# Patient Record
Sex: Male | Born: 1985 | Race: Black or African American | Hispanic: No | Marital: Single | Smoking: Never smoker
Health system: Southern US, Community
[De-identification: ages and names within clinical notes are randomized; demographics above are authoritative.]

## PROBLEM LIST (undated history)

## (undated) DIAGNOSIS — J029 Acute pharyngitis, unspecified: Secondary | ICD-10-CM

## (undated) HISTORY — PX: NO PAST SURGERIES: SHX2092

## (undated) HISTORY — DX: Acute pharyngitis, unspecified: J02.9

---

## 2011-08-30 ENCOUNTER — Emergency Department (HOSPITAL_COMMUNITY)
Admission: EM | Admit: 2011-08-30 | Discharge: 2011-08-30 | Disposition: A | Discharge status: Home / Self Care | Payer: Self-pay | Attending: Emergency Medicine | Admitting: Emergency Medicine

## 2011-08-30 ENCOUNTER — Encounter (HOSPITAL_COMMUNITY): Payer: Self-pay | Admitting: Emergency Medicine

## 2011-08-30 DIAGNOSIS — J069 Acute upper respiratory infection, unspecified: Secondary | ICD-10-CM | POA: Insufficient documentation

## 2011-08-30 DIAGNOSIS — R197 Diarrhea, unspecified: Secondary | ICD-10-CM | POA: Insufficient documentation

## 2011-08-30 DIAGNOSIS — R51 Headache: Secondary | ICD-10-CM | POA: Insufficient documentation

## 2011-08-30 LAB — POCT I-STAT, CHEM 8
Creatinine, Ser: 1.2 mg/dL (ref 0.50–1.35)
Hemoglobin: 17.3 g/dL — ABNORMAL HIGH (ref 13.0–17.0)
Potassium: 3.9 mEq/L (ref 3.5–5.1)
Sodium: 139 mEq/L (ref 135–145)

## 2011-08-30 LAB — CBC
HCT: 48.1 % (ref 39.0–52.0)
Hemoglobin: 16.6 g/dL (ref 13.0–17.0)
MCH: 31.6 pg (ref 26.0–34.0)
MCV: 91.6 fL (ref 78.0–100.0)
RBC: 5.25 MIL/uL (ref 4.22–5.81)

## 2011-08-30 LAB — DIFFERENTIAL
Eosinophils Absolute: 0.1 10*3/uL (ref 0.0–0.7)
Eosinophils Relative: 1 % (ref 0–5)
Lymphs Abs: 1.4 10*3/uL (ref 0.7–4.0)
Monocytes Relative: 16 % — ABNORMAL HIGH (ref 3–12)
Neutrophils Relative %: 71 % (ref 43–77)

## 2011-08-30 MED ORDER — IBUPROFEN 800 MG PO TABS
800.0000 mg | ORAL_TABLET | Freq: Once | ORAL | Status: AC
  Administered 2011-08-30: 800 mg via ORAL
  Filled 2011-08-30: qty 1

## 2011-08-30 MED ORDER — SODIUM CHLORIDE 0.9 % IV BOLUS (SEPSIS)
1000.0000 mL | Freq: Once | INTRAVENOUS | Status: AC
  Administered 2011-08-30: 1000 mL via INTRAVENOUS

## 2011-08-30 NOTE — ED Notes (Signed)
C/o fever, headache, sore throat, and runny nose since Monday.  Reports girlfriend had Mono 3 weeks ago.

## 2011-08-30 NOTE — Discharge Instructions (Signed)
Kenneth Olsen he did not have strep throat. He had an upper respiratory virus today. The mono test was also negative. Gargle with warm salt water to relieve your throat pain. Take ibuprofen 800 every 6 hours with food x24 hours. He can take Benadryl at night to help you sleep for your upper respiratory viral symptoms. Followup with PCP of your choice or get one from the list below you're not better in 2 days. He can take Lomotil over-the-counter if your diarrhea continues. Return to the ER for uncontrolled nausea vomiting and diarrhea. All other labs were normal with no signs of infection that needs to be treated with antibiotics.   RESOURCE GUIDE  Dental Problems  Patients with Medicaid: [facility] [phone] [address]                                           [phone] [address] Phone:  [phone]                                                  Phone:  [phone]  If unable to pay or uninsured, contact:  [facility] or [facility] to become qualified for the adult dental clinic.  Chronic Pain Problems Contact [facility]  [phone] Patients need to be referred by their primary care doctor.  Insufficient Money for Medicine Contact [employer]:  call "211" or [facility] 2148418132.  No Primary Care Doctor Call [facility]  [phone] Other agencies that provide inexpensive medical care    [facility]  [phone]    [facility]  [phone]    [facility]  [phone]    [facility]  [phone]    [facility]  5674539386    [facility]  [phone]  Psychological Services [facility]  [phone] [facility]  [phone] [facility]   [phone] (emergency services [phone])  Substance Abuse Resources [facility]  4037569595 [facility] 3396167206 [facility]  [phone] Floydene Flock [phone] Residential & Outpatient Substance Abuse Program  [phone]  Abuse/Neglect [facility] Child Abuse Hotline 281-085-5133 [facility] Child Abuse Hotline (603) 181-9396 (After Hours)  Emergency Shelter [facility] [phone]  M[facility] [place] [facility] [phone] [facility] [phone]  MRSA Hotline #:   865-527-6539    [facility]  [facility]     [employer]                          [facility] Dept[facility] [address] [place]                       [address]      [address]  [address]  Cristobal Goldmann Phone:  [phone]                                   Phone:  [phone]                 Phone:  [phone]  [facility] Mental [facility] Phone:  [phone]  [facility] Child Abuse Hotline (769) 755-2057 (939)572-2196 (After Hours)   Antibiotic Nonuse  Your caregiver felt that the infection or problem was not one that would be helped with an antibiotic. Infections may be caused by viruses or bacteria. Only a caregiver can tell which one of these is the likely cause of an illness. A cold is the most common cause of infection in both adults and children. A cold is a virus. Antibiotic treatment will have no effect on a viral infection. Viruses can lead to many lost days of work caring for sick children and many missed days of school. Children may catch as many as 10 "colds" or "flus" per year during which they can be tearful, cranky, and uncomfortable. The goal of treating a virus is aimed at keeping the ill person comfortable. Antibiotics are medications used to help the body fight bacterial infections. There are relatively few types of bacteria that cause infections but there are hundreds of viruses. While both viruses and bacteria cause  infection they are very different types of germs. A viral infection will typically go away by itself within 7 to 10 days. Bacterial infections may spread or get worse without antibiotic treatment. Examples of bacterial infections are:  Sore throats (like strep throat or tonsillitis).   Infection in the lung (pneumonia).   Ear and skin infections.  Examples of viral infections are:  Colds or flus.   Most coughs and bronchitis.   Sore throats not caused by Strep.   Runny noses.  It is often best not to take an antibiotic when a viral infection is the cause of the problem. Antibiotics can kill off the helpful bacteria that we have inside our body and allow harmful bacteria to start growing. Antibiotics can cause side effects such as allergies, nausea, and diarrhea without helping to improve the symptoms of the viral infection. Additionally, repeated uses of antibiotics can cause bacteria inside of our body to become resistant. That resistance can be passed onto harmful bacterial. The next time you have an infection it may be harder to treat if antibiotics are used when they are not needed. Not treating with antibiotics allows our own immune system to develop and take care of infections more efficiently. Also, antibiotics will work better for [place] when they are prescribed for bacterial infections. Treatments for a child that is ill may include:  Give extra fluids throughout the day to stay hydrated.   Get plenty of rest.   Only give your child over-the-counter or prescription medicines for pain, discomfort, or fever as directed by your caregiver.   The use of a cool mist humidifier may help stuffy noses.   Cold medications if suggested by your caregiver.  Your caregiver may decide to start you on an antibiotic if:  The problem you were seen for today continues for a longer length of time than expected.   You develop a secondary bacterial infection.  SEEK MEDICAL CARE IF:  Fever lasts  longer than 5 days.   Symptoms continue to get worse after 5 to 7 days or become severe.   Difficulty in breathing develops.   Signs of dehydration develop (poor drinking, rare urinating, dark colored urine).   Changes in behavior or worsening tiredness (listlessness or lethargy).  Document Released: 07/08/2001 Document Revised: 04/18/2011 Document Reviewed: 01/04/2009 [facility] Patient Information 2012 [place], [place].Antibiotic Nonuse  Your caregiver felt that the infection or problem was not one that would be helped with an antibiotic. Infections may be caused by viruses or bacteria. Only a caregiver can tell which one of these is the likely cause of an illness. A cold is the most common cause of infection in both adults and children. A cold is a virus. Antibiotic treatment will have no effect on a viral infection. Viruses can lead to many lost days of work caring for sick children and many missed days of school. Children may catch as many as 10 "colds" or "flus" per year during which they can be tearful, cranky, and uncomfortable. The goal of treating a virus is aimed at keeping the ill person comfortable. Antibiotics are medications used to help the body fight bacterial infections. There are relatively few types of bacteria that cause infections but there are hundreds of viruses. While both viruses and bacteria cause infection they are very different types of germs. A viral infection will typically go away by itself within 7 to 10 days. Bacterial infections may spread or get worse without antibiotic treatment. Examples of bacterial infections are:  Sore throats (like strep throat or tonsillitis).   Infection in the lung (pneumonia).   Ear and skin infections.  Examples of viral infections are:  Colds or flus.   Most coughs and bronchitis.   Sore throats not caused by Strep.   Runny noses.  It is often best not to take an antibiotic when a viral infection is the cause of the problem.  Antibiotics can kill off the helpful bacteria that we have inside our body and allow harmful bacteria to start growing. Antibiotics can cause side effects such as allergies, nausea, and diarrhea without helping to improve the symptoms of the viral infection. Additionally, repeated uses of antibiotics can cause bacteria inside of our body to become resistant. That resistance can be passed onto harmful bacterial. The next time you have an infection it may be harder to treat if antibiotics are used when they are not needed. Not treating with antibiotics allows our own immune system to develop and take care of infections more efficiently. Also, antibiotics will work better for [place] when they are prescribed for bacterial infections. Treatments for a child that is ill may include:  Give extra fluids throughout the day to stay hydrated.   Get plenty of rest.   Only give your child over-the-counter or prescription medicines for pain, discomfort, or fever as directed by your caregiver.   The use of a cool mist humidifier may help stuffy noses.   Cold medications if suggested by your caregiver.  Your caregiver may decide to start you on an antibiotic if:  The problem you were seen for today continues for a longer length of time than expected.   You develop a secondary bacterial infection.  SEEK MEDICAL CARE IF:  Fever lasts longer than 5 days.   Symptoms continue to get worse after 5 to 7 days or become severe.   Difficulty in breathing develops.   Signs of dehydration  develop (poor drinking, rare urinating, dark colored urine).   Changes in behavior or worsening tiredness (listlessness or lethargy).  Document Released: 07/08/2001 Document Revised: 04/18/2011 Document Reviewed: 01/04/2009 [facility] Patient Information 2012 [place], [place].Antibiotic Nonuse  Your caregiver felt that the infection or problem was not one that would be helped with an antibiotic. Infections may be caused by viruses or  bacteria. Only a caregiver can tell which one of these is the likely cause of an illness. A cold is the most common cause of infection in both adults and children. A cold is a virus. Antibiotic treatment will have no effect on a viral infection. Viruses can lead to many lost days of work caring for sick children and many missed days of school. Children may catch as many as 10 "colds" or "flus" per year during which they can be tearful, cranky, and uncomfortable. The goal of treating a virus is aimed at keeping the ill person comfortable. Antibiotics are medications used to help the body fight bacterial infections. There are relatively few types of bacteria that cause infections but there are hundreds of viruses. While both viruses and bacteria cause infection they are very different types of germs. A viral infection will typically go away by itself within 7 to 10 days. Bacterial infections may spread or get worse without antibiotic treatment. Examples of bacterial infections are:  Sore throats (like strep throat or tonsillitis).   Infection in the lung (pneumonia).   Ear and skin infections.  Examples of viral infections are:  Colds or flus.   Most coughs and bronchitis.   Sore throats not caused by Strep.   Runny noses.  It is often best not to take an antibiotic when a viral infection is the cause of the problem. Antibiotics can kill off the helpful bacteria that we have inside our body and allow harmful bacteria to start growing. Antibiotics can cause side effects such as allergies, nausea, and diarrhea without helping to improve the symptoms of the viral infection. Additionally, repeated uses of antibiotics can cause bacteria inside of our body to become resistant. That resistance can be passed onto harmful bacterial. The next time you have an infection it may be harder to treat if antibiotics are used when they are not needed. Not treating with antibiotics allows our own immune system to  develop and take care of infections more efficiently. Also, antibiotics will work better for [place] when they are prescribed for bacterial infections. Treatments for a child that is ill may include:  Give extra fluids throughout the day to stay hydrated.   Get plenty of rest.   Only give your child over-the-counter or prescription medicines for pain, discomfort, or fever as directed by your caregiver.   The use of a cool mist humidifier may help stuffy noses.   Cold medications if suggested by your caregiver.  Your caregiver may decide to start you on an antibiotic if:  The problem you were seen for today continues for a longer length of time than expected.   You develop a secondary bacterial infection.  SEEK MEDICAL CARE IF:  Fever lasts longer than 5 days.   Symptoms continue to get worse after 5 to 7 days or become severe.   Difficulty in breathing develops.   Signs of dehydration develop (poor drinking, rare urinating, dark colored urine).   Changes in behavior or worsening tiredness (listlessness or lethargy).  Document Released: 07/08/2001 Document Revised: 04/18/2011 Document Reviewed: 01/04/2009 [facility] Patient Information 2012 [place], [place].Antibiotic Nonuse  Your caregiver felt that the infection or problem was not one that would be helped with an antibiotic. Infections may be caused by viruses or bacteria. Only a caregiver can tell which one of these is the likely cause of an illness. A cold is the most common cause of infection in both adults and children. A cold is a virus. Antibiotic treatment will have no effect on a viral infection. Viruses can lead to many lost days of work caring for sick children and many missed days of school. Children may catch as many as 10 "colds" or "flus" per year during which they can be tearful, cranky, and uncomfortable. The goal of treating a virus is aimed at keeping the ill person comfortable. Antibiotics are medications used to help the  body fight bacterial infections. There are relatively few types of bacteria that cause infections but there are hundreds of viruses. While both viruses and bacteria cause infection they are very different types of germs. A viral infection will typically go away by itself within 7 to 10 days. Bacterial infections may spread or get worse without antibiotic treatment. Examples of bacterial infections are:  Sore throats (like strep throat or tonsillitis).   Infection in the lung (pneumonia).   Ear and skin infections.  Examples of viral infections are:  Colds or flus.   Most coughs and bronchitis.   Sore throats not caused by Strep.   Runny noses.  It is often best not to take an antibiotic when a viral infection is the cause of the problem. Antibiotics can kill off the helpful bacteria that we have inside our body and allow harmful bacteria to start growing. Antibiotics can cause side effects such as allergies, nausea, and diarrhea without helping to improve the symptoms of the viral infection. Additionally, repeated uses of antibiotics can cause bacteria inside of our body to become resistant. That resistance can be passed onto harmful bacterial. The next time you have an infection it may be harder to treat if antibiotics are used when they are not needed. Not treating with antibiotics allows our own immune system to develop and take care of infections more efficiently. Also, antibiotics will work better for [place] when they are prescribed for bacterial infections. Treatments for a child that is ill may include:  Give extra fluids throughout the day to stay hydrated.   Get plenty of rest.   Only give your child over-the-counter or prescription medicines for pain, discomfort, or fever as directed by your caregiver.   The use of a cool mist humidifier may help stuffy noses.   Cold medications if suggested by your caregiver.  Your caregiver may decide to start you on an antibiotic if:  The  problem you were seen for today continues for a longer length of time than expected.   You develop a secondary bacterial infection.  SEEK MEDICAL CARE IF:  Fever lasts longer than 5 days.   Symptoms continue to get worse after 5 to 7 days or become severe.   Difficulty in breathing develops.   Signs of dehydration develop (poor drinking, rare urinating, dark colored urine).   Changes in behavior or worsening tiredness (listlessness or lethargy).  Document Released: 07/08/2001 Document Revised: 04/18/2011 Document Reviewed: 01/04/2009 [facility] Patient Information 2012 [place], [place].

## 2011-08-30 NOTE — ED Provider Notes (Signed)
History     CSN: [id number]  Arrival date & time 08/30/11  7829   First MD Initiated Contact with Patient 08/30/11 1956      Chief Complaint  Patient presents with  . Fever    (Consider location/radiation/quality/duration/timing/severity/associated sxs/prior treatment) Patient is a 26 y.o. male presenting with fever. The history is provided by the patient and a parent. No language interpreter was used.  Fever Primary symptoms of the febrile illness include fever, fatigue, headaches and diarrhea. Primary symptoms do not include cough, wheezing, shortness of breath, abdominal pain, nausea, vomiting or dysuria. The current episode started 3 to 5 days ago. This is a new problem. The problem has not changed since onset.  Patient reports fatigue diarrhea headache fever malaise and sore throat and subjective fever and chills since Monday.  States that his girlfriend was diagnosed with mono and strep 3 weeks ago. Mom states that people at work are also sick.  History reviewed. No pertinent past medical history.  History reviewed. No pertinent past surgical history.  No family history on file.  History  Substance Use Topics  . Smoking status: Never Smoker   . Smokeless tobacco: Not on file  . Alcohol Use: Yes      Review of Systems  Constitutional: Positive for fever and fatigue.  Respiratory: Negative for cough, shortness of breath and wheezing.   Gastrointestinal: Positive for diarrhea. Negative for nausea, vomiting and abdominal pain.  Genitourinary: Negative for dysuria.  Neurological: Positive for headaches.    Allergies  Review of patient's allergies indicates no known allergies.  Home Medications  No current outpatient prescriptions on file.  BP 142/73  Pulse 77  Temp(Src) 99 F (37.2 C) (Oral)  Resp 16  SpO2 100%  Physical Exam  Nursing note and vitals reviewed. Constitutional: He is oriented to person, place, and time. He appears well-developed and  well-nourished.  HENT:  Head: Normocephalic.  Right Ear: Tympanic membrane normal.  Left Ear: Tympanic membrane normal.  Nose: Mucosal edema and rhinorrhea present. No sinus tenderness.  Mouth/Throat: Uvula is midline and mucous membranes are normal. Posterior oropharyngeal edema and posterior oropharyngeal erythema present. No oropharyngeal exudate or tonsillar abscesses.    Eyes: Conjunctivae and EOM are normal. Pupils are equal, round, and reactive to light.  Neck: Normal range of motion. Neck supple.  Cardiovascular: Normal rate.   Pulmonary/Chest: Effort normal.  Abdominal: Soft.  Musculoskeletal: Normal range of motion.  Neurological: He is alert and oriented to person, place, and time.  Skin: Skin is warm and dry.  Psychiatric: He has a normal mood and affect.    ED Course  Procedures (including critical care time)   Labs Reviewed  RAPID STREP SCREEN  MONONUCLEOSIS SCREEN  CBC  DIFFERENTIAL  EPSTEIN-BARR VIRUS VCA ANTIBODY PANEL   No results found.   No diagnosis found.    MDM  Negative stress negative mono test. Patient was given 1 L of normal saline in the ER today and ibuprofen. Afebrile. Suspect a viral infection. Treat with ibuprofen and Benadryl and hydration.   Labs Reviewed  CBC - Abnormal; Notable for the following:    WBC 11.3 (*)    All other components within normal limits  DIFFERENTIAL - Abnormal; Notable for the following:    Neutro Abs 8.0 (*)    Monocytes Relative 16 (*)    Monocytes Absolute 1.8 (*)    All other components within normal limits  POCT I-STAT, CHEM 8 - Abnormal; Notable for the following:  Calcium, Ion 1.09 (*)    Hemoglobin 17.3 (*)    All other components within normal limits  RAPID STREP SCREEN  MONONUCLEOSIS SCREEN  EPSTEIN-BARR VIRUS VCA ANTIBODY PANEL          Remi Haggard, NP 08/30/11 2217

## 2011-08-31 NOTE — ED Provider Notes (Signed)
Medical screening examination/treatment/procedure(s) were performed by non-physician practitioner and as supervising physician I was immediately available for consultation/collaboration.   Amar Keenum A Perseus Westall, MD 08/31/11 0023 

## 2011-09-01 ENCOUNTER — Inpatient Hospital Stay (HOSPITAL_COMMUNITY)
Admission: EM | Admit: 2011-09-01 | Discharge: 2011-09-03 | DRG: 866 | Disposition: A | Discharge status: Home / Self Care | Payer: Self-pay | Attending: Internal Medicine | Admitting: Internal Medicine

## 2011-09-01 ENCOUNTER — Encounter (HOSPITAL_COMMUNITY): Payer: Self-pay | Admitting: Emergency Medicine

## 2011-09-01 DIAGNOSIS — J029 Acute pharyngitis, unspecified: Secondary | ICD-10-CM

## 2011-09-01 DIAGNOSIS — B2700 Gammaherpesviral mononucleosis without complication: Secondary | ICD-10-CM | POA: Diagnosis present

## 2011-09-01 DIAGNOSIS — J391 Other abscess of pharynx: Secondary | ICD-10-CM

## 2011-09-01 DIAGNOSIS — B2790 Infectious mononucleosis, unspecified without complication: Principal | ICD-10-CM | POA: Diagnosis present

## 2011-09-01 DIAGNOSIS — R930 Abnormal findings on diagnostic imaging of skull and head, not elsewhere classified: Secondary | ICD-10-CM | POA: Diagnosis present

## 2011-09-01 DIAGNOSIS — J392 Other diseases of pharynx: Secondary | ICD-10-CM | POA: Diagnosis present

## 2011-09-01 DIAGNOSIS — J019 Acute sinusitis, unspecified: Secondary | ICD-10-CM | POA: Diagnosis present

## 2011-09-01 LAB — CBC
MCV: 90.9 fL (ref 78.0–100.0)
Platelets: 244 10*3/uL (ref 150–400)
RBC: 5.28 MIL/uL (ref 4.22–5.81)
WBC: 16.5 10*3/uL — ABNORMAL HIGH (ref 4.0–10.5)

## 2011-09-01 LAB — DIFFERENTIAL
Eosinophils Relative: 0 % (ref 0–5)
Lymphocytes Relative: 10 % — ABNORMAL LOW (ref 12–46)
Lymphs Abs: 1.7 10*3/uL (ref 0.7–4.0)
Monocytes Relative: 11 % (ref 3–12)
Neutrophils Relative %: 79 % — ABNORMAL HIGH (ref 43–77)

## 2011-09-01 LAB — POCT I-STAT, CHEM 8
BUN: 11 mg/dL (ref 6–23)
Chloride: 102 mEq/L (ref 96–112)
Potassium: 3.6 mEq/L (ref 3.5–5.1)
Sodium: 139 mEq/L (ref 135–145)

## 2011-09-01 LAB — MONONUCLEOSIS SCREEN: Mono Screen: NEGATIVE

## 2011-09-01 MED ORDER — DEXAMETHASONE SODIUM PHOSPHATE 10 MG/ML IJ SOLN
10.0000 mg | Freq: Once | INTRAMUSCULAR | Status: AC
  Administered 2011-09-01: 10 mg via INTRAVENOUS
  Filled 2011-09-01: qty 1

## 2011-09-01 MED ORDER — SODIUM CHLORIDE 0.9 % IV SOLN: 1000.0000 mL | INTRAVENOUS | Status: DC

## 2011-09-01 MED ORDER — HYDROMORPHONE HCL PF 1 MG/ML IJ SOLN
1.0000 mg | INTRAMUSCULAR | Status: DC | PRN
  Administered 2011-09-02: 1 mg via INTRAVENOUS
  Filled 2011-09-01: qty 1

## 2011-09-01 MED ORDER — SODIUM CHLORIDE 0.9 % IV BOLUS (SEPSIS)
2000.0000 mL | Freq: Once | INTRAVENOUS | Status: AC
  Administered 2011-09-01: 2000 mL via INTRAVENOUS

## 2011-09-01 MED ORDER — HYDROMORPHONE HCL PF 1 MG/ML IJ SOLN: 1.0000 mg | Freq: Once | INTRAMUSCULAR | Status: DC

## 2011-09-01 MED ORDER — PENICILLIN G BENZATHINE 1200000 UNIT/2ML IM SUSP
1.2000 10*6.[IU] | Freq: Once | INTRAMUSCULAR | Status: AC
  Administered 2011-09-01: 1.2 10*6.[IU] via INTRAMUSCULAR
  Filled 2011-09-01: qty 2

## 2011-09-01 MED ORDER — SODIUM CHLORIDE 0.9 % IV SOLN
1000.0000 mL | INTRAVENOUS | Status: DC
  Administered 2011-09-02: 1000 mL via INTRAVENOUS

## 2011-09-01 NOTE — ED Provider Notes (Signed)
History     CSN: [id number]  Arrival date & time 09/01/11  2119   First MD Initiated Contact with Patient 09/01/11 2148      Chief Complaint  Patient presents with  . Sore Throat    (Consider location/radiation/quality/duration/timing/severity/associated sxs/prior treatment) Patient is a 26 y.o. male presenting with pharyngitis. The history is provided by the patient. No language interpreter was used.  Sore Throat This is a new problem. The current episode started in the past 7 days. The problem occurs constantly. Associated symptoms include a fever and swollen glands. Pertinent negatives include no abdominal pain, nausea or vomiting. The symptoms are aggravated by drinking and swallowing. He has tried nothing for the symptoms.   Patient's complaint of sore throat since Monday. Has taken Tylenol for pain. Now presently he cannot swallow he is spitting sputum out. No past medical history. No allergies. Does not smoke.   History reviewed. No pertinent past medical history.  History reviewed. No pertinent past surgical history.  No family history on file.  History  Substance Use Topics  . Smoking status: Never Smoker   . Smokeless tobacco: Not on file  . Alcohol Use: Yes      Review of Systems  Constitutional: Positive for fever.  HENT: Negative.   Eyes: Negative.   Respiratory: Negative.  Negative for shortness of breath and stridor.   Cardiovascular: Negative.   Gastrointestinal: Negative.  Negative for nausea, vomiting and abdominal pain.  Neurological: Negative.   Psychiatric/Behavioral: Negative.   All other systems reviewed and are negative.    Allergies  Review of patient's allergies indicates no known allergies.  Home Medications   Current Outpatient Rx  Name Route Sig Dispense Refill  . ACETAMINOPHEN 325 MG PO TABS Oral Take 650 mg by mouth every 6 (six) hours as needed. For pain      BP 139/78  Pulse 86  Temp(Src) 100.7 F (38.2 C) (Oral)  Resp 19   SpO2 99%  Physical Exam  Nursing note and vitals reviewed. Constitutional: He is oriented to person, place, and time. He appears well-developed and well-nourished.  HENT:  Head: Normocephalic.  Right Ear: Tympanic membrane normal.  Left Ear: Tympanic membrane normal.  Nose: Nose normal.  Mouth/Throat: Uvula is midline. No uvula swelling. Oropharyngeal exudate, posterior oropharyngeal edema and posterior oropharyngeal erythema present. No tonsillar abscesses.    Eyes: Conjunctivae and EOM are normal. Pupils are equal, round, and reactive to light.  Neck: Normal range of motion. Neck supple.  Cardiovascular: Normal rate.   Pulmonary/Chest: Effort normal.  Abdominal: Soft.  Musculoskeletal: Normal range of motion.  Neurological: He is alert and oriented to person, place, and time.  Skin: Skin is warm and dry.  Psychiatric: He has a normal mood and affect.    ED Course  Procedures (including critical care time)   Labs Reviewed  RAPID STREP SCREEN   No results found.   No diagnosis found.    MDM  12 midnight Report given to Dr. Oletta Lamas.  Patient will remain in CDU on dehydration protocol until he can swallow and drink fluids.  Repeat decadron.  I would give him rx for 10 days of pcn and place on prednisone.         Remi Haggard, NP 09/01/11 [phone]

## 2011-09-01 NOTE — ED Notes (Addendum)
Pt to ED c/o sore throat for 1 week. Difficulty swallowing. Last took Tylenol at 1600 today.

## 2011-09-01 NOTE — ED Notes (Signed)
C/o sorethroat and fever since Monday.

## 2011-09-02 ENCOUNTER — Observation Stay (HOSPITAL_COMMUNITY): Payer: Self-pay

## 2011-09-02 ENCOUNTER — Encounter (HOSPITAL_COMMUNITY): Payer: Self-pay | Admitting: Internal Medicine

## 2011-09-02 DIAGNOSIS — J029 Acute pharyngitis, unspecified: Secondary | ICD-10-CM | POA: Diagnosis present

## 2011-09-02 HISTORY — DX: Acute pharyngitis, unspecified: J02.9

## 2011-09-02 LAB — COMPREHENSIVE METABOLIC PANEL
Albumin: 3.4 g/dL — ABNORMAL LOW (ref 3.5–5.2)
Alkaline Phosphatase: 90 U/L (ref 39–117)
BUN: 12 mg/dL (ref 6–23)
Chloride: 102 mEq/L (ref 96–112)
GFR calc Af Amer: >90 mL/min (ref >90)
Glucose, Bld: 127 mg/dL — ABNORMAL HIGH (ref 70–99)
Potassium: 3.9 mEq/L (ref 3.5–5.1)
Total Bilirubin: 0.4 mg/dL (ref 0.3–1.2)

## 2011-09-02 LAB — TECHNOLOGIST SMEAR REVIEW

## 2011-09-02 LAB — CBC
MCV: 89.8 fL (ref 78.0–100.0)
Platelets: 275 10*3/uL (ref 150–400)
RDW: 12 % (ref 11.5–15.5)
WBC: 18.5 10*3/uL — ABNORMAL HIGH (ref 4.0–10.5)

## 2011-09-02 LAB — DIFFERENTIAL
Basophils Absolute: 0 10*3/uL (ref 0.0–0.1)
Eosinophils Absolute: 0 10*3/uL (ref 0.0–0.7)
Lymphs Abs: 0.9 10*3/uL (ref 0.7–4.0)
Monocytes Absolute: 0.9 10*3/uL (ref 0.1–1.0)
Monocytes Relative: 5 % (ref 3–12)

## 2011-09-02 LAB — EXPECTORATED SPUTUM ASSESSMENT W GRAM STAIN, RFLX TO RESP C

## 2011-09-02 LAB — EPSTEIN-BARR VIRUS VCA ANTIBODY PANEL: EBV VCA IgM: 0.19 {ISR}

## 2011-09-02 LAB — HIV ANTIBODY (ROUTINE TESTING W REFLEX): HIV: NONREACTIVE

## 2011-09-02 MED ORDER — IBUPROFEN 800 MG PO TABS
800.0000 mg | ORAL_TABLET | Freq: Three times a day (TID) | ORAL | Status: AC
  Administered 2011-09-02 – 2011-09-03 (×4): 800 mg via ORAL
  Filled 2011-09-02 (×4): qty 1

## 2011-09-02 MED ORDER — ACETAMINOPHEN 325 MG PO TABS: 650.0000 mg | ORAL_TABLET | Freq: Four times a day (QID) | ORAL | Status: DC | PRN

## 2011-09-02 MED ORDER — DEXAMETHASONE SODIUM PHOSPHATE 10 MG/ML IJ SOLN
10.0000 mg | Freq: Two times a day (BID) | INTRAMUSCULAR | Status: AC
  Administered 2011-09-02 (×2): 10 mg via INTRAVENOUS
  Filled 2011-09-02 (×2): qty 1

## 2011-09-02 MED ORDER — ONDANSETRON HCL 4 MG/2ML IJ SOLN: 4.0000 mg | Freq: Four times a day (QID) | INTRAMUSCULAR | Status: DC | PRN

## 2011-09-02 MED ORDER — SODIUM CHLORIDE 0.9 % IV SOLN
INTRAVENOUS | Status: DC
  Administered 2011-09-02 – 2011-09-03 (×2): via INTRAVENOUS

## 2011-09-02 MED ORDER — IOHEXOL 300 MG/ML  SOLN
100.0000 mL | Freq: Once | INTRAMUSCULAR | Status: AC | PRN
  Administered 2011-09-02: 100 mL via INTRAVENOUS

## 2011-09-02 MED ORDER — ACETAMINOPHEN 650 MG RE SUPP: 650.0000 mg | Freq: Four times a day (QID) | RECTAL | Status: DC | PRN

## 2011-09-02 MED ORDER — MORPHINE SULFATE 2 MG/ML IJ SOLN: 2.0000 mg | INTRAMUSCULAR | Status: DC | PRN

## 2011-09-02 MED ORDER — HYDROMORPHONE HCL PF 1 MG/ML IJ SOLN
0.5000 mg | Freq: Once | INTRAMUSCULAR | Status: AC
  Administered 2011-09-02: 0.5 mg via INTRAVENOUS
  Filled 2011-09-02: qty 1

## 2011-09-02 MED ORDER — SODIUM CHLORIDE 0.9 % IV SOLN
3.0000 g | Freq: Four times a day (QID) | INTRAVENOUS | Status: DC
  Administered 2011-09-02 – 2011-09-03 (×5): 3 g via INTRAVENOUS
  Filled 2011-09-02 (×7): qty 3

## 2011-09-02 MED ORDER — ENOXAPARIN SODIUM 40 MG/0.4ML ~~LOC~~ SOLN
40.0000 mg | SUBCUTANEOUS | Status: DC
  Administered 2011-09-02: 40 mg via SUBCUTANEOUS
  Filled 2011-09-02 (×2): qty 0.4

## 2011-09-02 NOTE — ED Provider Notes (Signed)
Pt still with very hoarse voice, still unable to drink water, barely able to open mouth for me to examine.  Uvula appears to be midline.  Sig cervical adenopathy.  Will get CT scan due to inability to open mouth, trismus, hoarse voice and sig neck pain and fever, concern for retropharyngeal cellulitis or abscess.  sats are normal and lung sounds are ok.   6:30 AM Discussed with Dr. Annalee Genta.  He reports difficult to tell if pt will improve on its own.  He suggests and I agree with brief admission for continued IVF's, pain control, IV unasyn rather than just PCN and observation to ensure he is able to swallow and no breathing issues ensue over 24 hours.  Can follow up with ENT or ENT can be consulted formally if any airway issues arise or clinically worsening symptoms.  I will admit to medicine for IVF's, pain control and IV abx.     Gavin Pound. Oletta Lamas, MD 09/02/11 [phone]

## 2011-09-02 NOTE — H&P (Signed)
Hospital Admission Note Date: 09/02/2011  Patient name:  Kenneth Olsen  Medical record number:  [id number] Date of birth:  1986-04-30  Age: 26 y.o. Gender: male PCP:    DEFAULT,PROVIDER, MD, MD  Medical Service:   Internal Medicine Teaching Service   Attending physician:  Dr. Josem Kaufmann First Contact:   Dr. Bosie Clos    Pager: [phone]  Second Contact:   Dr. Anselm Jungling   Pager: [phone] After Hours:    First Contact   Pager: [phone]      Second Contact  Pager: [phone]   Chief Complaint: Sore throat  History of Present Illness: Patient is a 26 y.o. male with no significant past medical history, who presents to [facility] for evaluation of sore throat and difficulty swallowing. Patient reports his symptoms began one week prior to admission. He complained of sore throat, runny nose, and chills. He went to the ER on Friday (3 days prior to admission) where he was diagnosed with viral URI. Over the weekend his symptoms had gotten worse and he was unable to open his mouth or swallow, with fevers and chills. He reports his girlfriend is currently diagnosed with infectious mononucleosis. He reports being in close contact with her. His brother whom he lives with also has a cough. He denies joint pain, neck pain, nausea, vomiting, constipation, diarrhea, urinary symptoms such has discharge, burning, and frequency.   Current Outpatient Medications:  Current Outpatient Prescriptions  Medication Sig Dispense Refill  . acetaminophen (TYLENOL) 325 MG tablet Take 650 mg by mouth every 6 (six) hours as needed. For pain        Allergies: No Known Allergies   Past Medical History: History reviewed. No pertinent past medical history.  Past Surgical History: History reviewed. No pertinent past surgical history.  Family History: No family history on file.  Social History: History   Social History  . Marital Status: Single    Spouse Name: N/A    Number of Children: N/A  . Years of Education: college    Occupational History  .  [employer]   Social History Main Topics  . Smoking status: Never Smoker   . Smokeless tobacco: Not on file  . Alcohol Use: Yes  . Drug Use: No  . Sexually Active: Yes -- Male partner(s)    Birth Control/ Protection: None     Girlfriend on OCP   Other Topics Concern  . Not on file   Social History Narrative   [place] [place]ors degree in [place] and [place]    Review of Systems: Constitutional:  Complains of fever, chills, but denies diaphoresis, appetite change and fatigue.  HEENT: Denies photophobia, eye pain, redness, hearing loss, ear pain, but does complain of congestion, sore throat, rhinorrhea, sneezing, mouth sores, trouble swallowing.  Respiratory: Denies SOB, DOE, cough, chest tightness, and wheezing.  Cardiovascular: Denies chest pain, palpitations and leg swelling.  Gastrointestinal: Denies nausea, vomiting, abdominal pain, diarrhea, constipation, blood in stool and abdominal distention.  Genitourinary: Denies dysuria, urgency, frequency, hematuria, flank pain and difficulty urinating.  Musculoskeletal: Denies myalgias, back pain, joint swelling, arthralgias and gait problem.   Skin: Denies pallor, rash and wound.  Neurological: Denies dizziness, seizures, syncope, weakness, light-headedness, numbness and headaches.     Vital Signs: Filed Vitals:   09/02/11 0756  BP: 136/94  Pulse: 79  Temp: 98.3 F (36.8 C)  Resp: 20     Physical Exam: General: Vital signs reviewed and noted. Well-developed,  well-nourished, in no acute distress; alert, appropriate and cooperative throughout examination.  Head: Normocephalic, atraumatic.  Eyes: PERRL, EOMI, No signs of anemia or jaundince.  Nose: Nasal turbinates inflamed  Throat: Oropharynx erythematous, unable to open mouth widely due to pain, dry mucous membranes   Neck: Cervical adenopathy bilaterally, tender to palpation   Lungs:  Normal respiratory effort. Clear to auscultation BL without crackles or wheezes.  Heart: RRR. S1 and S2 normal without gallop, murmur, or rubs.  Abdomen:  BS normoactive. Soft, Nondistended, non-tender.  No masses or organomegaly.  Extremities: No pretibial edema.  Neurologic: A&O X3, CN II - XII are grossly intact. Nonfocal otherwise  Skin: No visible rashes, scars.   Lab results: Basic Metabolic Panel: Recent Labs  [facility] 09/01/11 2224 08/30/11 2100   NA 139 139   K 3.6 3.9   CL 102 104   CO2 -- --   GLUCOSE 92 92   BUN 11 8   CREATININE 1.10 1.20   CALCIUM -- --   MG -- --   PHOS -- --   CBC: Recent Labs  Basename 09/01/11 2224 09/01/11 2212 08/30/11 2045   WBC -- 16.5* 11.3*   NEUTROABS -- 13.0* 8.0*   HGB 17.7* 17.0 --   HCT 52.0 48.0 --   MCV -- 90.9 91.6   PLT -- 244 224   Rapid Strep - neg Monospot - neg   Imaging results:  Ct Soft Tissue Neck W Contrast  09/02/2011   CT NECK WITH CONTRAST  Technique: Findings: There is opacification of the left maxillary antrum, left ethmoid air cells, and portions of the left frontal and sphenoid sinuses.  Changes are likely to represent sinusitis.  Tonsils appear moderately prominent consistent with inflammatory swelling. No tonsillar or peritonsillar abscess is demonstrated.  Cervical carotid and jugular vessels appear patent.  No displacement or effacement of the cervical fat planes.  Salivary glands appear symmetrical.  The epiglottis appears mildly thickened and edematous.  There are small circumscribed fluid collections anterior to the base of the epiglottis and superior to the thyroid bone, likely in the tongue base region.  These may represent small abscesses. Scattered cervical and submental lymph nodes without pathologic enlargement, likely reactive.  Thyroid gland is unremarkable.  Visualized lung apices are unremarkable.  Cervical airways appear patent.    IMPRESSION: Mild thickening of the epiglottis.  Small  circumscribed fluid collections in the base of the tongue and in the suprahyoid soft tissues suggesting small abscesses.  Probable reactive cervical and submental lymph nodes.  Mild tonsillar swelling.  Opacification of the left paranasal sinuses suggesting sinusitis.    Assessment & Plan: This is a 26 year old man with no significant past medical history admitted for sore throat of 1 week duration.   1.) Acute Pharyngitis - Patient presenting with fever, sore throat, exudates, and without cough, making Group A Strep Pharyngitis a likely culprit. Rapid strep was negative, however a negative test does not definitively rule out strep pharyngitis. The patient also provides history of his girlfriend having infectious mononucleosis. Monospot test is negative, however there tends to be a higher rate of false negatives early in the illness. Thus both mononucleosis and bacterial pharyngitis is on the differential. Gonococcal pharyngitis also considered but less likely given history and clinical presentation.   Plan: -Admit for hydration, IV abx -Will continue patient on Unasyn which was started in the ER. Patient should be on PCN based regimen. Patient can be transitioned to amoxicillin or augmentin (  amox/clav) orally once throat swelling improves -Will give 2 doses of dexamethasone 12 hours apart to help symptomatically  -Scheduled ibuprofen tid -Sputum culture  -HIV antibody   -ENT was consulted, they reviewed patient's CT scan, recommended continuing steroids and will be glad to see patient if he does not improve clinically in next day or so   DVT PPX - Lovenox       Melida Quitter, M.D. (PGY3)      Date/ Time: 09/02/2011 8:54 am      I have seen and examined the patient. I reviewed the resident/fellow note and agree with the findings and plan of care as documented. My additions and revisions are included.   Signature:  ____________________________________________     Internal Medicine  Teaching Service Attending    Date:    ____________________________________________

## 2011-09-02 NOTE — ED Provider Notes (Signed)
.  Medical screening examination/treatment/procedure(s) were performed by non-physician practitioner and as supervising physician I was immediately available for consultation/collaboration.   Carleene Cooper III, MD 09/02/11 [phone]

## 2011-09-02 NOTE — ED Notes (Signed)
Admitting doctor at bedside 

## 2011-09-03 DIAGNOSIS — R930 Abnormal findings on diagnostic imaging of skull and head, not elsewhere classified: Secondary | ICD-10-CM | POA: Diagnosis present

## 2011-09-03 DIAGNOSIS — B2700 Gammaherpesviral mononucleosis without complication: Secondary | ICD-10-CM | POA: Diagnosis present

## 2011-09-03 LAB — EPSTEIN-BARR VIRUS VCA ANTIBODY PANEL
EBV EA IgG: 0.77 {ISR}
EBV NA IgG: 3.17 {ISR} — ABNORMAL HIGH

## 2011-09-03 LAB — CBC
Platelets: 284 10*3/uL (ref 150–400)
RBC: 4.49 MIL/uL (ref 4.22–5.81)
WBC: 18.7 10*3/uL — ABNORMAL HIGH (ref 4.0–10.5)

## 2011-09-03 MED ORDER — IBUPROFEN 600 MG PO TABS: 600.0000 mg | ORAL_TABLET | Freq: Three times a day (TID) | ORAL | Status: AC | PRN

## 2011-09-03 MED ORDER — PENICILLIN V POTASSIUM 500 MG PO TABS
500.0000 mg | ORAL_TABLET | Freq: Four times a day (QID) | ORAL | Status: DC
  Administered 2011-09-03: 500 mg via ORAL
  Filled 2011-09-03 (×4): qty 1

## 2011-09-03 MED ORDER — PREDNISONE 20 MG PO TABS
20.0000 mg | ORAL_TABLET | Freq: Every day | ORAL | Status: DC
  Filled 2011-09-03: qty 1

## 2011-09-03 MED ORDER — PENICILLIN V POTASSIUM 500 MG PO TABS: 500.0000 mg | ORAL_TABLET | Freq: Four times a day (QID) | ORAL | Status: AC

## 2011-09-03 MED ORDER — PREDNISONE 20 MG PO TABS: 20.0000 mg | ORAL_TABLET | Freq: Every day | ORAL | Status: AC

## 2011-09-03 NOTE — H&P (Signed)
Internal Medicine Attending Admission Note Date: 09/03/2011  Patient name: Kenneth Olsen Medical record number: [id number] Date of birth: 1986-02-08 Age: 26 y.o. Gender: male  I saw and evaluated the patient. I reviewed the resident's note and I agree with the resident's findings and plan as documented in the resident's note.  Chief Complaint(s): Sore throat and difficulty swallowing x 1 week.  History - key components related to admission:  Kenneth Olsen is a 26 year old man with no previous past medical history who presents for evaluation of a sore throat and difficulty swallowing for one week. The symptoms initially were a runny nose and chills with a sore throat. 3 days prior to admission he presented to the emergency department with these complaints and was diagnosed with an upper respiratory infection. Over the next 3 days his symptoms progressed to the point that he was unable to open his mouth and could not swallow his secretions. He also continued to have fevers and chills. He denied any rashes, muscle or joint pain, diarrhea, dysuria, but admitted to a sick contact with his girlfriend who was recently diagnosed with mononucleosis and a brother who had a cough.  In the emergency department a CT scan was obtained which demonstrated left maxillary and ethmoid opacification consistent with sinusitis and small circumscribed  fluid collections anterior to the base of the epiglottis and at the base of the tongue. He was therefore admitted for aggressive IV antibiotic therapy and observation.  Physical Exam - key components related to admission:  Filed Vitals:   09/02/11 1038 09/02/11 1100 09/02/11 2132 09/03/11 0518  BP: 138/80 133/85 118/74 128/76  Pulse: 77 67 61 70  Temp: 98.5 F (36.9 C) 97.7 F (36.5 C) 98.9 F (37.2 C) 97.1 F (36.2 C)  TempSrc: Oral Oral    Resp: 20 20 20 18   SpO2: 100% 100% 100% 100%   Gen.: Well-developed, well-nourished, man lying comfortably in bed in no acute  distress. He stated his symptoms were markedly improved upon admission.  HEENT: He was able to open his mouth without difficulty or soreness. There were no excessive secretions in his oropharynx. I was unable to visualize the posterior pharynx. There was halitosis present. Neck: Shotty cervical adenopathy. Lungs: Clear to auscultation bilaterally without wheezes, rhonchi, or rales. Heart: Regular rate and rhythm without murmurs, rubs, or gallops. Abdomen: Soft, nontender, without masses. Extremities: Without edema.  Lab results:  Basic Metabolic Panel:  Basename 09/02/11 1140 09/01/11 2224  NA 139 139  K 3.9 3.6  CL 102 102  CO2 25 --  GLUCOSE 127* 92  BUN 12 11  CREATININE 0.82 1.10  CALCIUM 9.3 --  MG -- --  PHOS -- --   Liver Function Tests:  Basename 09/02/11 1140  AST 24  ALT 13  ALKPHOS 90  BILITOT 0.4  PROT 7.7  ALBUMIN 3.4*   CBC:  Basename 09/03/11 0530 09/02/11 1140 09/01/11 2212  WBC 18.7* 18.5* --  NEUTROABS -- 16.7* 13.0*  HGB 14.1 15.3 --  HCT 40.1 43.0 --  MCV 89.3 89.8 --  PLT 284 275 --   Misc. Labs:  Rapid strep test negative Monospot negative   Imaging results:  Ct Soft Tissue Neck W Contrast  09/02/2011  *RADIOLOGY REPORT*  Clinical Data: Sore throat, fever, difficulty swallowing for 1 week.  CT NECK WITH CONTRAST  Technique:  Multidetector CT imaging of the neck was performed with intravenous contrast.  Contrast: OMNIPAQUE IOHEXOL 300 MG/ML  SOLN  Comparison: None.  Findings:  There is opacification of the left maxillary antrum, left ethmoid air cells, and portions of the left frontal and sphenoid sinuses.  Changes are likely to represent sinusitis.  Tonsils appear moderately prominent consistent with inflammatory swelling. No tonsillar or peritonsillar abscess is demonstrated.  Cervical carotid and jugular vessels appear patent.  No displacement or effacement of the cervical fat planes.  Salivary glands appear symmetrical.  The epiglottis  appears mildly thickened and edematous.  There are small circumscribed fluid collections anterior to the base of the epiglottis and superior to the thyroid bone, likely in the tongue base region.  These may represent small abscesses. Scattered cervical and submental lymph nodes without pathologic enlargement, likely reactive.  Thyroid gland is unremarkable.  Visualized lung apices are unremarkable.  Cervical airways appear patent.  IMPRESSION: Mild thickening of the epiglottis.  Small circumscribed fluid collections in the base of the tongue and in the suprahyoid soft tissues suggesting small abscesses.  Probable reactive cervical and submental lymph nodes.  Mild tonsillar swelling.  Opacification of the left paranasal sinuses suggesting sinusitis.  Original Report Authenticated By: Marlon Pel, M.D.   Assessment & Plan by Problem:  Kenneth Olsen is a 26 year old man who presents with a 1 week history of sore throat and difficulty swallowing. The symptoms are associated with subjective fevers and chills. Exam along with radiography suggests an acute left-sided sinusitis, pharyngitis, with some small abscesses. He responded extremely well to the Unasyn and steroids he received yesterday. He now is able to and swallow without difficulty and is symptomatically much improved. He would like to go home.  1) Pharyngitis/sinusitis: We will convert Kenneth Olsen to Pen V for 10 days. We will also continue the steroids at a dose of prednisone 20 mg daily for 5 days. He will be encouraged to use anti-inflammatories for any pain on an as-needed basis. Disposition will include followup in the primary care clinic to assure he is improved. If his symptoms completely resolve there will be no need for followup radiography. If his symptoms were to persist or worsen ENT would be reconsult for assessment after we repeated his CT scan of the neck. I agree with the housestaff's plan to discharge Kenneth Olsen home today.

## 2011-09-03 NOTE — Progress Notes (Signed)
Patient was given discharge instructions.  He verbalized understanding and confirmed that his prescriptions were called into [facility].  He took all belongings with him.  He verbalized understanding regarding checking for signs of sore throat.  He will follow up with his MD on 09/09/11.  Sherial Ebrahim N

## 2011-09-03 NOTE — Progress Notes (Signed)
Chart reviewed , noted that pt is employed however does not have insurance. Will assist with any d/c needs as needed.  Johny Shock RN MPH Case Manager [phone]

## 2011-09-03 NOTE — Progress Notes (Signed)
Subjective: No acute events overnight  Objective: Vital signs in last 24 hours: Filed Vitals:   09/02/11 1038 09/02/11 1100 09/02/11 2132 09/03/11 0518  BP: 138/80 133/85 118/74 128/76  Pulse: 77 67 61 70  Temp: 98.5 F (36.9 C) 97.7 F (36.5 C) 98.9 F (37.2 C) 97.1 F (36.2 C)  TempSrc: Oral Oral    Resp: 20 20 20 18   SpO2: 100% 100% 100% 100%   Weight change:   Intake/Output Summary (Last 24 hours) at 09/03/11 0926 Last data filed at 09/02/11 1900  Gross per 24 hour  Intake   1680 ml  Output      0 ml  Net   1680 ml   Physical Exam: General: Well-developed, well-nourished, in no acute distress; Head: Normocephalic, atraumatic. Eyes: PERRLA, EOMI, No signs of anemia or jaundice. Nose: Moist mucous membranes, no purulence, normal turbinates Throat: Oropharynx nonerythematous, no exudate appreciated.  Neck: supple, no masses, no carotid Bruits, no JVD appreciated. Lungs: Normal respiratory effort. Clear to auscultation bilaterally from apices to bases without crackles or wheezes appreciated. Heart: normal rate, regular rhythm, normal S1 and S2, no gallop, murmur, or rubs appreciated. Abdomen: BS normoactive. Soft, Nondistended, non-tender. No masses or organomegaly appreciated. Extremities: No pretibial edema, distal pulses intact Neurologic: grossly non-focal, alert and oriented x3, appropriate and cooperative throughout examination.    Lab Results: Basic Metabolic Panel:  Lab 09/02/11 4782 09/01/11 2224  NA 139 139  K 3.9 3.6  CL 102 102  CO2 25 --  GLUCOSE 127* 92  BUN 12 11  CREATININE 0.82 1.10  CALCIUM 9.3 --  MG -- --  PHOS -- --   Liver Function Tests:  Lab 09/02/11 1140  AST 24  ALT 13  ALKPHOS 90  BILITOT 0.4  PROT 7.7  ALBUMIN 3.4*    CBC:  Lab 09/03/11 0530 09/02/11 1140 09/01/11 2212  WBC 18.7* 18.5* --  NEUTROABS -- 16.7* 13.0*  HGB 14.1 15.3 --  HCT 40.1 43.0 --  MCV 89.3 89.8 --  PLT 284 275 --     Micro Results: Recent  Results (from the past 240 hour(s))  RAPID STREP SCREEN     Status: Normal   Collection Time   08/30/11  7:43 PM      Component Value Range Status Comment   Streptococcus, Group A Screen (Direct) NEGATIVE  NEGATIVE  Final   RAPID STREP SCREEN     Status: Normal   Collection Time   09/01/11  9:49 PM      Component Value Range Status Comment   Streptococcus, Group A Screen (Direct) NEGATIVE  NEGATIVE  Final   TECHNOLOGIST SMEAR REVIEW     Status: Normal   Collection Time   09/02/11 11:40 AM      Component Value Range Status Comment   Path Review RARE ATYPICAL LYMPHOCYTES   Final RED CELL MORPHOLOGY UNREMARKABLE  CULTURE, SPUTUM-ASSESSMENT     Status: Normal   Collection Time   09/02/11  3:33 PM      Component Value Range Status Comment   Specimen Description SPUTUM   Final    Special Requests NONE   Final    Sputum evaluation     Final    Value: THIS SPECIMEN IS ACCEPTABLE. RESPIRATORY CULTURE REPORT TO FOLLOW.   Report Status 09/02/2011 FINAL   Final   CULTURE, RESPIRATORY     Status: Normal (Preliminary result)   Collection Time   09/02/11  3:33 PM  Component Value Range Status Comment   Specimen Description SPUTUM   Final    Special Requests NONE   Final    Gram Stain     Final    Value: FEW WBC PRESENT, PREDOMINANTLY PMN     FEW SQUAMOUS EPITHELIAL CELLS PRESENT     FEW GRAM POSITIVE COCCI     IN PAIRS RARE GRAM NEGATIVE RODS   Culture PENDING   Incomplete    Report Status PENDING   Incomplete    Studies/Results: Ct Soft Tissue Neck W Contrast  09/02/2011  *RADIOLOGY REPORT*  Clinical Data: Sore throat, fever, difficulty swallowing for 1 week.  CT NECK WITH CONTRAST  Technique:  Multidetector CT imaging of the neck was performed with intravenous contrast.  Contrast: OMNIPAQUE IOHEXOL 300 MG/ML  SOLN  Comparison: None.  Findings: There is opacification of the left maxillary antrum, left ethmoid air cells, and portions of the left frontal and sphenoid sinuses.  Changes  are likely to represent sinusitis.  Tonsils appear moderately prominent consistent with inflammatory swelling. No tonsillar or peritonsillar abscess is demonstrated.  Cervical carotid and jugular vessels appear patent.  No displacement or effacement of the cervical fat planes.  Salivary glands appear symmetrical.  The epiglottis appears mildly thickened and edematous.  There are small circumscribed fluid collections anterior to the base of the epiglottis and superior to the thyroid bone, likely in the tongue base region.  These may represent small abscesses. Scattered cervical and submental lymph nodes without pathologic enlargement, likely reactive.  Thyroid gland is unremarkable.  Visualized lung apices are unremarkable.  Cervical airways appear patent.  IMPRESSION: Mild thickening of the epiglottis.  Small circumscribed fluid collections in the base of the tongue and in the suprahyoid soft tissues suggesting small abscesses.  Probable reactive cervical and submental lymph nodes.  Mild tonsillar swelling.  Opacification of the left paranasal sinuses suggesting sinusitis.  Original Report Authenticated By: Marlon Pel, M.D.   Medications: I have reviewed the patient's current medications. Scheduled Meds:   . ampicillin-sulbactam (UNASYN) IV  3 g Intravenous Q6H  . dexamethasone  10 mg Intravenous Q12H  . enoxaparin  40 mg Subcutaneous Q24H  . ibuprofen  800 mg Oral TID   Continuous Infusions:   . sodium chloride 125 mL/hr at 09/03/11 0343  . DISCONTD: sodium chloride 1,000 mL (09/02/11 0007)  . DISCONTD: sodium chloride     PRN Meds:.acetaminophen, acetaminophen, morphine, ondansetron, DISCONTD:  HYDROmorphone (DILAUDID) injection  Assessment/Plan: HD #21 for 26 year old man admitted for sore throat of 1 week duration with exposure to infectious mononucleosis.   1.) Group A Strep Pharyngitis, likely - presenting with fever, sore throat, exudates, and without cough, making Group A Strep  Pharyngitis a likely culprit. Rapid strep was negative, however a negative test does not definitively rule out strep pharyngitis.  Initial Monospot test is negative, Currently on Unasyn, s/p dexamethasone x2, tolerating full diet Plan:  -transition to oral Penicillin V for 10 day course -cont scheduled ibuprofen tid  -Sputum culture pending -HIV antibody negative -cont steroids with prednisone 20 mg qd x 5 days -EBV pending    Lab 09/03/11 0530 09/02/11 1140 09/01/11 2224 09/01/11 2212  HGB 14.1 15.3 17.7* --  HCT 40.1 43.0 52.0 --  WBC 18.7* 18.5* -- 16.5*  PLT 284 275 -- 244    Differential  Ref. Range 09/01/2011 22:12 09/02/2011 11:40  Neutrophils Relative Latest Range: 43-77 % 79 (H) 90 (H)  Lymphocytes Relative Latest Range:  12-46 % 10 (L) 5 (L)  Monocytes Relative Latest Range: 3-12 % 11 5  Eosinophils Relative Latest Range: 0-5 % 0 0  Basophils Relative Latest Range: 0-1 % 0 0  NEUT# Latest Range: 1.7-7.7 K/uL 13.0 (H) 16.7 (H)  Lymphocytes Absolute Latest Range: 0.7-4.0 K/uL 1.7 0.9  Monocytes Absolute Latest Range: 0.1-1.0 K/uL 1.8 (H) 0.9  Eosinophils Absolute Latest Range: 0.0-0.7 K/uL 0.0 0.0  Basophils Absolute Latest Range: 0.0-0.1 K/uL 0.0 0.0  WBC Morphology No range found MILD LEFT SHIFT (1-5% METAS, OCC MYELO, OCC BANDS)       LOS: 2 days   Kenneth Olsen 09/03/2011, 9:26 AM

## 2011-09-03 NOTE — Discharge Summary (Signed)
Internal Medicine [facility] Discharge Note  Name: Kenneth Olsen MRN: [id number] DOB: [birth year] 26 y.o.  Date of Admission: 09/01/2011  9:29 PM Date of Discharge: 09/03/2011 Attending Physician: Doneen Poisson, MD  Discharge Diagnosis: Principal Problem:  *Pharyngitis Active Problems:  EBV positive mononucleosis syndrome  Abnormal CT of paranasal sinuses   Discharge Medications: Medication List  As of 09/03/2011  2:42 PM   TAKE these medications         acetaminophen 325 MG tablet   Commonly known as: TYLENOL   Take 650 mg by mouth every 6 (six) hours as needed. For pain      ibuprofen 600 MG tablet   Commonly known as: ADVIL,MOTRIN   Take 1 tablet (600 mg total) by mouth every 8 (eight) hours as needed for pain.      penicillin v potassium 500 MG tablet   Commonly known as: VEETID   Take 1 tablet (500 mg total) by mouth 4 (four) times daily.      predniSONE 20 MG tablet   Commonly known as: DELTASONE   Take 1 tablet (20 mg total) by mouth daily with breakfast.            Disposition and follow-up:   KennethKenneth Olsen was discharged from [facility] in stable and improved condition.  He is to follow-up with the Internal Medicine Clinic as below.  At that time he should be assessed for resolution of his pharyngitis.  In addition, pending labs at time of discharge should be reviewed which includes positive Malachi Carl Virus Panel indicating past mononucleosis infection.  Follow-up Appointments: Follow-up Information    Follow up with [facility], MD on 09/09/2011. (at 2:15pm)    Contact information:   Internal Medicine Clinic [address] [place] [address] [phone]         Discharge Orders    Future Appointments: Provider: Department: Dept Phone: Center:   09/09/2011 2:15 PM Zollie Beckers, MD [facility] [phone] [facility]     Future Orders Please Complete By Expires   Discharge instructions       Comments:   Keep scheduled appointment with at the Internal Medicine Clinic and take the medications as prescribed.   Call MD for:  difficulty breathing, headache or visual disturbances         Consultations:ENT reviewed CT scan    Procedures Performed:  Ct Soft Tissue Neck W Contrast  09/02/2011  *RADIOLOGY REPORT*  Clinical Data: Sore throat, fever, difficulty swallowing for 1 week.  CT NECK WITH CONTRAST  Technique:  Multidetector CT imaging of the neck was performed with intravenous contrast.  Contrast: OMNIPAQUE IOHEXOL 300 MG/ML  SOLN  Comparison: None.  Findings: There is opacification of the left maxillary antrum, left ethmoid air cells, and portions of the left frontal and sphenoid sinuses.  Changes are likely to represent sinusitis.  Tonsils appear moderately prominent consistent with inflammatory swelling. No tonsillar or peritonsillar abscess is demonstrated.  Cervical carotid and jugular vessels appear patent.  No displacement or effacement of the cervical fat planes.  Salivary glands appear symmetrical.  The epiglottis appears mildly thickened and edematous.  There are small circumscribed fluid collections anterior to the base of the epiglottis and superior to the thyroid bone, likely in the tongue base region.  These may represent small abscesses. Scattered cervical and submental lymph nodes without pathologic enlargement, likely reactive.  Thyroid gland is unremarkable.  Visualized lung apices are unremarkable.  Cervical  airways appear patent.  IMPRESSION: Mild thickening of the epiglottis.  Small circumscribed fluid collections in the base of the tongue and in the suprahyoid soft tissues suggesting small abscesses.  Probable reactive cervical and submental lymph nodes.  Mild tonsillar swelling.  Opacification of the left paranasal sinuses suggesting sinusitis.  Original Report Authenticated By: Marlon Pel, M.D.    Admission HPI: Patient is a 27 y.o. male with no  significant past medical history, who presents to [facility] for evaluation of sore throat and difficulty swallowing. Patient reports his symptoms began one week prior to admission. He complained of sore throat, runny nose, and chills. He went to the ER on Friday (3 days prior to admission) where he was diagnosed with viral URI. Over the weekend his symptoms had gotten worse and he was unable to open his mouth or swallow, with fevers and chills. He reports his girlfriend is currently diagnosed with infectious mononucleosis. He reports being in close contact with her. His brother whom he lives with also has a cough. He denies joint pain, neck pain, nausea, vomiting, constipation, diarrhea, urinary symptoms such has discharge, burning, and frequency.   Admission Physical Exam:  General:  Vital signs reviewed and noted. Well-developed, well-nourished, in no acute distress; alert, appropriate and cooperative throughout examination.   Head:  Normocephalic, atraumatic.   Eyes:  PERRL, EOMI, No signs of anemia or jaundince.   Nose:  Nasal turbinates inflamed   Throat:  Oropharynx erythematous, unable to open mouth widely due to pain, dry mucous membranes   Neck:  Cervical adenopathy bilaterally, tender to palpation   Lungs:  Normal respiratory effort. Clear to auscultation BL without crackles or wheezes.   Heart:  RRR. S1 and S2 normal without gallop, murmur, or rubs.   Abdomen:  BS normoactive. Soft, Nondistended, non-tender. No masses or organomegaly.   Extremities:  No pretibial edema.   Neurologic:  A&O X3, CN II - XII are grossly intact. Nonfocal otherwise   Skin:  No visible rashes, scars.      Hospital Course by problem list:   #1 Pharyngitis: Although his mononucleosis screen was negative, Kenneth Olsen pharyngitis is likely secondary to Malachi Carl virus infectious mononucleosis.  He had positive EBV Panel indicating past exposure (positive VCA IgG, positive EBV NA IgG, negative VCA IgM, and negative EBV  EA IgG). He received Decadron 10 mg IV x2 in the ED, was admitted and treated with Unasyn IV 3g tid x 2 days, given maintenance IV fluids, and IV Dilaudid.  By Hospital Day #2 he was feeling much better and able to eat full regular meals without complaints of pain or sore throat. He was discharged with Penicillin V 500  Mg qid for 10 day course for possible bacterial pharyngitis in addition to 5 day course of prednisone 20 mg and prn Ibuprofen. He will follow-up in the Internal Medicine Clinic as noted above.  Discharge Vitals:  BP 128/76  Pulse 70  Temp(Src) 97.1 F (36.2 C) (Oral)  Resp 18  SpO2 100%  Discharge Labs:  Results for orders placed during the hospital encounter of 09/01/11 (from the past 24 hour(s))  CULTURE, SPUTUM-ASSESSMENT     Status: Normal   Collection Time   09/02/11  3:33 PM      Component Value Range   Specimen Description SPUTUM     Special Requests NONE     Sputum evaluation       Value: THIS SPECIMEN IS ACCEPTABLE. RESPIRATORY CULTURE REPORT TO FOLLOW.  Report Status 09/02/2011 FINAL    CULTURE, RESPIRATORY     Status: Normal (Preliminary result)   Collection Time   09/02/11  3:33 PM      Component Value Range   Specimen Description SPUTUM     Special Requests NONE     Gram Stain       Value: FEW WBC PRESENT, PREDOMINANTLY PMN     FEW SQUAMOUS EPITHELIAL CELLS PRESENT     FEW GRAM POSITIVE COCCI     IN PAIRS RARE GRAM NEGATIVE RODS   Culture NO GROWTH     Report Status PENDING    CBC     Status: Abnormal   Collection Time   09/03/11  5:30 AM      Component Value Range   WBC 18.7 (*) 4.0 - 10.5 (K/uL)   RBC 4.49  4.22 - 5.81 (MIL/uL)   Hemoglobin 14.1  13.0 - 17.0 (g/dL)   HCT 40.9  81.1 - 91.4 (%)   MCV 89.3  78.0 - 100.0 (fL)   MCH 31.4  26.0 - 34.0 (pg)   MCHC 35.2  30.0 - 36.0 (g/dL)   RDW 78.2  95.6 - 21.3 (%)   Platelets 284  150 - 400 (K/uL)    Signed: Gionni Vaca 09/03/2011, 2:42 PM   Time Spent on Discharge: >30

## 2011-09-05 LAB — CULTURE, RESPIRATORY W GRAM STAIN: Culture: NORMAL

## 2011-09-05 NOTE — Discharge Summary (Signed)
Agree with Dr. Marge Duncans discharge summary except Kenneth Olsen' pharyngitis was likely bacterial or secondary to a virus other than EBV.  His serology is consistent with a remote prior to infection and not a recent infection.  Therefore, he did not have EBV pharyngitis.

## 2011-09-09 ENCOUNTER — Encounter: Payer: Self-pay | Admitting: Internal Medicine

## 2011-09-09 ENCOUNTER — Ambulatory Visit (INDEPENDENT_AMBULATORY_CARE_PROVIDER_SITE_OTHER): Payer: Self-pay | Admitting: Internal Medicine

## 2011-09-09 VITALS — BP 118/71 | HR 78 | Temp 97.4°F | Ht 71.0 in | Wt 144.8 lb

## 2011-09-09 DIAGNOSIS — J029 Acute pharyngitis, unspecified: Secondary | ICD-10-CM

## 2011-09-09 NOTE — Assessment & Plan Note (Signed)
Resolved

## 2011-09-09 NOTE — Progress Notes (Signed)
Patient ID: Kenneth Olsen, male   DOB: 09/18/1985, 26 y.o.   MRN: [id number]  26 year old man with no past medical history who was recently admitted for pharyngitis from mono vs Group A strep with throat swelling.  He was discharged on pen V, steroid which he has been compliant with. His symptoms have resolved.  No new complaints.    Physical exam   General Appearance:     Filed Vitals:   09/09/11 1439  BP: 118/71  Pulse: 78  Temp: 97.4 F (36.3 C)  TempSrc: Oral  Height: 5\' 11"  (1.803 m)  Weight: 144 lb 12.8 oz (65.681 kg)  SpO2: 100%     Alert, cooperative, no distress, appears stated age  Head:    Normocephalic, without obvious abnormality, atraumatic  Eyes:    PERRL, conjunctiva/corneas clear, EOM's intact, fundi    benign, both eyes       Neck:   Supple, symmetrical, trachea midline, no adenopathy;       thyroid:  No enlargement/tenderness/nodules; no carotid   bruit or JVD  Lungs:     Clear to auscultation bilaterally, respirations unlabored  Chest wall:    No tenderness or deformity  Heart:    Regular rate and rhythm, S1 and S2 normal, no murmur, rub   or gallop  Abdomen:     Soft, non-tender, bowel sounds active all four quadrants,    no masses, no organomegaly  Extremities:   Extremities normal, atraumatic, no cyanosis or edema  Pulses:   2+ and symmetric all extremities  Skin:   Skin color, texture, turgor normal, no rashes or lesions  Neurologic:  nonfocal grossly    ROS-   Constitutional: Denies fever, chills, diaphoresis, appetite change and fatigue.  Respiratory: Denies SOB, DOE, cough, chest tightness,  and wheezing.   Cardiovascular: Denies chest pain, palpitations and leg swelling.  Gastrointestinal: Denies nausea, vomiting, abdominal pain, diarrhea, constipation, blood in stool and abdominal distention.  Skin: Denies pallor, rash and wound.  Neurological: Denies dizziness, light-headedness, numbness and headaches.

## 2012-06-02 ENCOUNTER — Emergency Department (HOSPITAL_BASED_OUTPATIENT_CLINIC_OR_DEPARTMENT_OTHER)
Admission: EM | Admit: 2012-06-02 | Discharge: 2012-06-02 | Disposition: A | Discharge status: Home / Self Care | Payer: BC Managed Care – PPO | Attending: Emergency Medicine | Admitting: Emergency Medicine

## 2012-06-02 ENCOUNTER — Emergency Department (HOSPITAL_BASED_OUTPATIENT_CLINIC_OR_DEPARTMENT_OTHER): Payer: BC Managed Care – PPO

## 2012-06-02 DIAGNOSIS — X58XXXA Exposure to other specified factors, initial encounter: Secondary | ICD-10-CM | POA: Insufficient documentation

## 2012-06-02 DIAGNOSIS — S29011A Strain of muscle and tendon of front wall of thorax, initial encounter: Secondary | ICD-10-CM

## 2012-06-02 DIAGNOSIS — Z79899 Other long term (current) drug therapy: Secondary | ICD-10-CM | POA: Insufficient documentation

## 2012-06-02 DIAGNOSIS — Z791 Long term (current) use of non-steroidal anti-inflammatories (NSAID): Secondary | ICD-10-CM | POA: Insufficient documentation

## 2012-06-02 DIAGNOSIS — Y939 Activity, unspecified: Secondary | ICD-10-CM | POA: Insufficient documentation

## 2012-06-02 DIAGNOSIS — S2341XA Sprain of ribs, initial encounter: Secondary | ICD-10-CM | POA: Insufficient documentation

## 2012-06-02 DIAGNOSIS — Z8709 Personal history of other diseases of the respiratory system: Secondary | ICD-10-CM | POA: Insufficient documentation

## 2012-06-02 DIAGNOSIS — Y929 Unspecified place or not applicable: Secondary | ICD-10-CM | POA: Insufficient documentation

## 2012-06-02 DIAGNOSIS — R0789 Other chest pain: Secondary | ICD-10-CM

## 2012-06-02 MED ORDER — IBUPROFEN 600 MG PO TABS: 600.0000 mg | ORAL_TABLET | Freq: Four times a day (QID) | ORAL | Status: DC | PRN

## 2012-06-02 NOTE — ED Notes (Signed)
Patient transported to X-ray 

## 2012-06-02 NOTE — ED Provider Notes (Signed)
Medical screening examination/treatment/procedure(s) were performed by non-physician practitioner and as supervising physician I was immediately available for consultation/collaboration.    Candelario Steppe L Kasheena Sambrano, MD 06/02/12 2301 

## 2012-06-02 NOTE — ED Provider Notes (Signed)
History     CSN: [id number]  Arrival date & time 06/02/12  4098   First MD Initiated Contact with Patient 06/02/12 1928      Chief Complaint  Patient presents with  . Chest Pain    (Consider location/radiation/quality/duration/timing/severity/associated sxs/prior treatment) HPI Comments: Patient's chest pain is right sided. Denies any recent trauma or strenuous activity prior to onset.  Patient is a 27 y.o. male presenting with chest pain. The history is provided by the patient. No language interpreter was used.  Chest Pain The chest pain began 6 - 12 hours ago. Episode Length: started this morning. Episode frequency: persistent. The chest pain is unchanged. The severity of the pain is moderate. The pain does not radiate. Chest pain is worsened by certain positions (pushing on the chest). Pertinent negatives for primary symptoms include no fever, no syncope, no shortness of breath, no palpitations, no abdominal pain, no nausea, no vomiting and no dizziness.  Pertinent negatives for associated symptoms include no diaphoresis, no near-syncope and no numbness. He tried nothing for the symptoms. Risk factors include male gender.  Procedure history is negative for cardiac catheterization, echocardiogram, persantine thallium, stress echo, stress thallium and exercise treadmill test.     Past Medical History  Diagnosis Date  . No pertinent past medical history   . Pharyngeal edema 09/02/2011    Past Surgical History  Procedure Date  . No past surgeries     No family history on file.  History  Substance Use Topics  . Smoking status: Never Smoker   . Smokeless tobacco: Never Used  . Alcohol Use: Yes     Comment: OCCASIONAL      Review of Systems  Constitutional: Negative for fever, chills and diaphoresis.  Respiratory: Negative for chest tightness and shortness of breath.   Cardiovascular: Positive for chest pain. Negative for palpitations, leg swelling, syncope and  near-syncope.  Gastrointestinal: Negative for nausea, vomiting, abdominal pain and diarrhea.  Skin: Negative for color change.  Neurological: Negative for dizziness, syncope, light-headedness and numbness.    Allergies  Review of patient's allergies indicates no known allergies.  Home Medications   Current Outpatient Rx  Name  Route  Sig  Dispense  Refill  . ACETAMINOPHEN 325 MG PO TABS   Oral   Take 650 mg by mouth every 6 (six) hours as needed. For pain         . IBUPROFEN 600 MG PO TABS   Oral   Take 1 tablet (600 mg total) by mouth every 6 (six) hours as needed for pain.   28 tablet   0     BP 142/98  Pulse 70  Temp 98.4 F (36.9 C) (Oral)  Resp 16  SpO2 99%  Physical Exam  Constitutional: He appears well-developed and well-nourished. No distress.  HENT:  Head: Atraumatic.  Eyes: Pupils are equal, round, and reactive to light.  Cardiovascular: Normal rate, regular rhythm and normal heart sounds.   No murmur heard.      Pain worsens with palpation of chest wall at and slightly right of the sternum.  Pulmonary/Chest: Effort normal and breath sounds normal. No respiratory distress. He has no wheezes. He exhibits no deformity and no swelling.  Abdominal: Soft. Bowel sounds are normal. He exhibits no distension.  Musculoskeletal: Normal range of motion.  Neurological: He is alert.  Skin: He is not diaphoretic.  Psychiatric: He has a normal mood and affect. His behavior is normal.    ED Course  Procedures (  including critical care time)  Labs Reviewed - No data to display Dg Chest 2 View  06/02/2012  *RADIOLOGY REPORT*  Clinical Data: Chest pain  CHEST - 2 VIEW  Comparison: None.  Findings: The heart size and vascular pattern are normal.  No pleural effusions.  Lungs are clear.  IMPRESSION: Normal study   Original Report Authenticated By: Esperanza Heir, M.D.      1. Muscle strain of chest wall   2. Chest wall pain      Date: 06/02/2012  Rate: 65  Rhythm:  normal sinus rhythm  QRS Axis: normal  Intervals: normal  ST/T Wave abnormalities: normal  Conduction Disutrbances:early repolarization  Narrative Interpretation:   Old EKG Reviewed: none available   MDM  Patient presents with persistent, non-radiating chest pain since this morning. Denies SOB, lightheadedness, dizziness, fever, chills, N/V, and abdominal pain. Denies taking anything to try and alleviate his symptoms. Pain is reproducible and made worse with palpation at and slightly right of the sternum. Physical exam is otherwise normal.  EKG shows NSR with some early repolarization. EKG non-concerning for MI or heart related cause. Based on hx and PE more likely that chest pain is MSK in origin. Will obtain CXR to rule out any other dx processes.  CXR is normal with no evidence of acute processes. Will treat with NSAIDs and advise patient to use a heat pack on the area to attempt to alleviate symptoms. Patient will be instructed to follow up with PCP.        Antony Madura, PA-C 06/02/12 2113

## 2012-06-02 NOTE — ED Notes (Signed)
Pt. Has had no reports of cough or cold.

## 2012-07-06 ENCOUNTER — Encounter (HOSPITAL_COMMUNITY): Payer: Self-pay | Admitting: Emergency Medicine

## 2012-07-06 ENCOUNTER — Other Ambulatory Visit: Payer: Self-pay

## 2012-07-06 ENCOUNTER — Emergency Department (HOSPITAL_COMMUNITY): Payer: BC Managed Care – PPO

## 2012-07-06 ENCOUNTER — Emergency Department (HOSPITAL_COMMUNITY)
Admission: EM | Admit: 2012-07-06 | Discharge: 2012-07-06 | Disposition: A | Discharge status: Home / Self Care | Payer: BC Managed Care – PPO | Attending: Emergency Medicine | Admitting: Emergency Medicine

## 2012-07-06 DIAGNOSIS — Z8709 Personal history of other diseases of the respiratory system: Secondary | ICD-10-CM | POA: Insufficient documentation

## 2012-07-06 DIAGNOSIS — R0789 Other chest pain: Secondary | ICD-10-CM

## 2012-07-06 MED ORDER — HYDROCODONE-ACETAMINOPHEN 5-325 MG PO TABS: 1.0000 | ORAL_TABLET | ORAL | Status: DC | PRN

## 2012-07-06 MED ORDER — OXYCODONE-ACETAMINOPHEN 5-325 MG PO TABS
1.0000 | ORAL_TABLET | Freq: Once | ORAL | Status: AC
  Administered 2012-07-06: 1 via ORAL
  Filled 2012-07-06: qty 1

## 2012-07-06 MED ORDER — ONDANSETRON 4 MG PO TBDP
4.0000 mg | ORAL_TABLET | Freq: Once | ORAL | Status: AC
  Administered 2012-07-06: 4 mg via ORAL
  Filled 2012-07-06: qty 1

## 2012-07-06 NOTE — ED Notes (Signed)
Pt c/o chest pain and pressure that started last week. Pt sts he feels his heart pounding.No N/V or LOC.skin PWD.VSS

## 2012-07-06 NOTE — ED Provider Notes (Signed)
Medical screening examination/treatment/procedure(s) were performed by non-physician practitioner and as supervising physician I was immediately available for consultation/collaboration.   Hanley Seamen, MD 07/06/12 [phone]

## 2012-07-06 NOTE — ED Notes (Signed)
PA speaking to pt at this time

## 2012-07-06 NOTE — ED Provider Notes (Signed)
History     CSN: [id number]  Arrival date & time 07/06/12  0108   First MD Initiated Contact with Patient 07/06/12 0116      Chief Complaint  Patient presents with  . Chest Pain    (Consider location/radiation/quality/duration/timing/severity/associated sxs/prior treatment) HPI  She presents to the emergency department for substernal chest pain that he's been having for the past month. He says that it comes and goes but when it comes it is usually constant for a week or 2. He says moving exacerbates the pain. Symptoms do not radiate anywhere. He was seen [facility] [place] in late January for the same thing and diagnosed with URI. He has no prior cardiac history and is healthy at baseline. He does not have a cardiologist. He denies SOB, weakness, fevers, cough, chills, abdominal pain, vomiting, burning in his throat, headache. Or any other symptoms.    Chest Pain  Pain location: substernal, left breast Pain quality: pressure Radiates to: nowhere Pain severity: 7/10 Onset quality: dull Timing: 1 week ago but has been constant since Progression: has stayed constant Relieved by: No medications Associated symptoms: no associated symptoms.   Past Medical History  Diagnosis Date  . No pertinent past medical history   . Pharyngeal edema 09/02/2011    Past Surgical History  Procedure Laterality Date  . No past surgeries      History reviewed. No pertinent family history.  History  Substance Use Topics  . Smoking status: Never Smoker   . Smokeless tobacco: Never Used  . Alcohol Use: Yes     Comment: OCCASIONAL      Review of Systems  Review of Systems  Gen: no weight loss, fevers, chills, night sweats  Eyes: no discharge or drainage, no occular pain or visual changes  Nose: no epistaxis or rhinorrhea  Mouth: no dental pain, no sore throat  Neck: no neck pain  Lungs:No wheezing, coughing or hemoptysis CV: + chest pain, palpitations, dependent edema or  orthopnea  Abd: no abdominal pain, nausea, vomiting  GU: no dysuria or gross hematuria  MSK:  No abnormalities  Neuro: no headache, no focal neurologic deficits  Skin: no abnormalities Psyche: negative.   Allergies  Review of patient's allergies indicates no known allergies.  Home Medications   Current Outpatient Rx  Name  Route  Sig  Dispense  Refill  . benzocaine (ORAJEL) 10 % mucosal gel   Mouth/Throat   Use as directed 1 application in the mouth or throat as needed for pain.         Marland Kitchen ibuprofen (ADVIL,MOTRIN) 200 MG tablet   Oral   Take 600 mg by mouth every 6 (six) hours as needed for pain.         Marland Kitchen HYDROcodone-acetaminophen (NORCO/VICODIN) 5-325 MG per tablet   Oral   Take 1 tablet by mouth every 4 (four) hours as needed for pain.   10 tablet   0     BP 139/67  Pulse 60  Resp 18  Ht 5\' 11"  (1.803 m)  Wt 150 lb (68.04 kg)  BMI 20.93 kg/m2  SpO2 100%  Physical Exam  Nursing note and vitals reviewed. Constitutional: He appears well-developed and well-nourished. No distress.  HENT:  Head: Normocephalic and atraumatic.  Eyes: Pupils are equal, round, and reactive to light.  Neck: Normal range of motion. Neck supple.  Cardiovascular: Normal rate and regular rhythm.   Pulmonary/Chest: Effort normal. Chest wall is not dull to percussion. He exhibits no  mass, no tenderness, no bony tenderness, no laceration, no crepitus, no edema, no deformity, no swelling and no retraction.  Abdominal: Soft.  Neurological: He is alert.  Skin: Skin is warm and dry.    ED Course  Procedures (including critical care time)  Labs Reviewed - No data to display Dg Chest 2 View  07/06/2012  *RADIOLOGY REPORT*  Clinical Data: Chest pain  CHEST - 2 VIEW  Comparison: 06/02/2012  Findings: Upper limits normal heart size noted. The lungs are clear. There is no evidence of focal airspace disease, pulmonary edema, suspicious pulmonary nodule/mass, pleural effusion, or pneumothorax. No  acute bony abnormalities are identified.  IMPRESSION: Upper limits normal heart size without evidence of acute cardiopulmonary disease.   Original Report Authenticated By: Harmon Pier, M.D.      1. Atypical chest pain       MDM    Date: 07/06/2012  Rate: 64  Rhythm:  sinus rhythm  QRS Axis: left ventricular hypertrophy  Intervals: normal  ST/T Wave abnormalities:  normal  Conduction Disutrbances:none  Narrative Interpretation:   Old EKG Reviewed: unchanged Jun 02, 2012  EKG and chest xray normal. Very low risk for coronary artery disease. After discussing with Dr. Read Drivers pt will be given pain medication in the ED and a referral to Cardiology for further management. Also advised to follow-up with his PCP.  Pt has been advised of the symptoms that warrant their return to the ED. Patient has voiced understanding and has agreed to follow-up with the PCP or specialist.            Dorthula Matas, PA 07/06/12 [phone]

## 2012-07-22 ENCOUNTER — Encounter: Payer: Self-pay | Admitting: Internal Medicine

## 2012-07-29 ENCOUNTER — Ambulatory Visit (HOSPITAL_COMMUNITY): Payer: BC Managed Care – PPO | Attending: Cardiovascular Disease | Admitting: Radiology

## 2012-07-29 ENCOUNTER — Ambulatory Visit (INDEPENDENT_AMBULATORY_CARE_PROVIDER_SITE_OTHER): Payer: BC Managed Care – PPO | Admitting: Cardiovascular Disease

## 2012-07-29 ENCOUNTER — Encounter: Payer: Self-pay | Admitting: Cardiovascular Disease

## 2012-07-29 VITALS — BP 126/75 | HR 65 | Ht 71.0 in | Wt 159.0 lb

## 2012-07-29 DIAGNOSIS — I379 Nonrheumatic pulmonary valve disorder, unspecified: Secondary | ICD-10-CM | POA: Insufficient documentation

## 2012-07-29 DIAGNOSIS — R079 Chest pain, unspecified: Secondary | ICD-10-CM

## 2012-07-29 DIAGNOSIS — I079 Rheumatic tricuspid valve disease, unspecified: Secondary | ICD-10-CM | POA: Insufficient documentation

## 2012-07-29 DIAGNOSIS — I059 Rheumatic mitral valve disease, unspecified: Secondary | ICD-10-CM | POA: Insufficient documentation

## 2012-07-29 DIAGNOSIS — R072 Precordial pain: Secondary | ICD-10-CM

## 2012-07-29 NOTE — Patient Instructions (Signed)
Your physician recommends that you schedule a follow-up appointment as needed with Dr. McAlhany  Your physician has requested that you have an echocardiogram. Echocardiography is a painless test that uses sound waves to create images of your heart. It provides your doctor with information about the size and shape of your heart and how well your heart's chambers and valves are working. This procedure takes approximately one hour. There are no restrictions for this procedure.    

## 2012-07-29 NOTE — Progress Notes (Signed)
Echocardiogram performed.  

## 2012-07-29 NOTE — Progress Notes (Signed)
Patient ID: Kenneth Olsen, male   DOB: 04/01/1986, 27 y.o.   MRN: [id number]

## 2012-07-29 NOTE — Progress Notes (Signed)
History of Present Illness: 27 yo male with no significant past medical history who is referred today for evaluation of chest pain. He has been seen in the ED two times over the last 2 months with c/o constant chest pain felt to be musculoskeletal in origin. He was given pain medications and referred to cardiology. He tells me that his chest pain has been present for 2 months. It is a sharp, stabbing pain under his left nipple. This pain lasts for several days. It is not exertional. No associated dyspnea, dizziness. The pain is worsened with movement. No changes with deep inspiration. No clear association with meals. He is having this pain today during examination and EKG.   Primary Care Physician: Gwynn Burly   Past Medical History  Diagnosis Date  . Pharyngitis 09/02/2011    Past Surgical History  Procedure Laterality Date  . No past surgeries      Current Outpatient Prescriptions  Medication Sig Dispense Refill  . benzocaine (ORAJEL) 10 % mucosal gel Use as directed 1 application in the mouth or throat as needed for pain.      Marland Kitchen HYDROcodone-acetaminophen (NORCO/VICODIN) 5-325 MG per tablet Take 1 tablet by mouth every 4 (four) hours as needed for pain.  10 tablet  0  . ibuprofen (ADVIL,MOTRIN) 200 MG tablet Take 600 mg by mouth every 6 (six) hours as needed for pain.       No current facility-administered medications for this visit.    No Known Allergies  History   Social History  . Marital Status: Single    Spouse Name: N/A    Number of Children: N/A  . Years of Education: college   Occupational History  .  [employer]   Social History Main Topics  . Smoking status: Never Smoker   . Smokeless tobacco: Never Used  . Alcohol Use: Yes     Comment: OCCASIONAL  . Drug Use: No  . Sexually Active: Yes -- Male partner(s)    Birth Control/ Protection: None     Comment: Girlfriend on OCP   Other Topics Concern  . Not on file   Social History Narrative   Lives  with brother and mother in [place]   Works at [employer]   Completed his bachelors degree in [place] and Art    Family History  Problem Relation Age of Onset  . Stroke Paternal Grandmother   . Heart disease Paternal Grandmother     Review of Systems:  As stated in the HPI and otherwise negative.   BP 126/75  Pulse 65  Ht 5\' 11"  (1.803 m)  Wt 159 lb (72.122 kg)  BMI 22.19 kg/m2  Physical Examination: General: Well developed, well nourished, NAD HEENT: OP clear, mucus membranes moist SKIN: warm, dry. No rashes. Neuro: No focal deficits Musculoskeletal: Muscle strength 5/5 all ext Psychiatric: Mood and affect normal Neck: No JVD, no carotid bruits, no thyromegaly, no lymphadenopathy. Lungs:Clear bilaterally, no wheezes, rhonci, crackles Cardiovascular: Regular rate and rhythm. No murmurs, gallops or rubs. Abdomen:Soft. Bowel sounds present. Non-tender.  Extremities: No lower extremity edema. Pulses are 2 + in the bilateral DP/PT.  EKG: NSR, rate 66 bpm. Possible LVH with early repolarization.   Assessment and Plan:   1. Chest pain: Atypical. Most likely musculoskeletal or GERD. I do not think this is cardiac. His EKG shows early repolarization with possible LVH. Will arrange echo to assess LV size and function, exclude structural heart disease. I have suggested that he use  ibuprofen prn for pain and try Pepcid 20 mg po Qdaily for possible GERD.

## 2012-08-15 ENCOUNTER — Encounter (HOSPITAL_COMMUNITY): Payer: Self-pay | Admitting: Emergency Medicine

## 2012-08-15 ENCOUNTER — Emergency Department (HOSPITAL_COMMUNITY)
Admission: EM | Admit: 2012-08-15 | Discharge: 2012-08-15 | Disposition: A | Discharge status: Home / Self Care | Payer: BC Managed Care – PPO | Attending: Emergency Medicine | Admitting: Emergency Medicine

## 2012-08-15 DIAGNOSIS — K029 Dental caries, unspecified: Secondary | ICD-10-CM | POA: Insufficient documentation

## 2012-08-15 DIAGNOSIS — K089 Disorder of teeth and supporting structures, unspecified: Secondary | ICD-10-CM | POA: Insufficient documentation

## 2012-08-15 DIAGNOSIS — K0889 Other specified disorders of teeth and supporting structures: Secondary | ICD-10-CM

## 2012-08-15 DIAGNOSIS — Z8709 Personal history of other diseases of the respiratory system: Secondary | ICD-10-CM | POA: Insufficient documentation

## 2012-08-15 DIAGNOSIS — R220 Localized swelling, mass and lump, head: Secondary | ICD-10-CM | POA: Insufficient documentation

## 2012-08-15 MED ORDER — OXYCODONE-ACETAMINOPHEN 5-325 MG PO TABS
1.0000 | ORAL_TABLET | Freq: Once | ORAL | Status: AC
  Administered 2012-08-15: 1 via ORAL
  Filled 2012-08-15: qty 1

## 2012-08-15 MED ORDER — OXYCODONE-ACETAMINOPHEN 5-325 MG PO TABS: ORAL_TABLET | ORAL | Status: DC

## 2012-08-15 MED ORDER — AMOXICILLIN 500 MG PO CAPS: 500.0000 mg | ORAL_CAPSULE | Freq: Three times a day (TID) | ORAL | Status: AC

## 2012-08-15 MED ORDER — OXYCODONE-ACETAMINOPHEN 5-325 MG PO TABS: 2.0000 | ORAL_TABLET | ORAL | Status: AC | PRN

## 2012-08-15 NOTE — ED Notes (Signed)
Pt alert, arrives from home, c/o dental pain and swelling, onset was was last night, resp even, unlabored, skin pwd, airway patent, no stridor noted

## 2012-08-15 NOTE — ED Provider Notes (Signed)
History     CSN: [id number]  Arrival date & time 08/15/12  0415   First MD Initiated Contact with Patient 08/15/12 [phone]      Chief Complaint  Patient presents with  . Dental Pain    (Consider location/radiation/quality/duration/timing/severity/associated sxs/prior treatment) HPI  Kenneth Olsen is a 27 y.o. male complaining of complaining of dental pain and swelling to the left upper jaw the course of the last 24 hours. Patient denies fever, difficulty swallowing, shortness of breath, change in vision. Pain is described as severe, 8/10, exacerbated by opening mouth and chewing.  Past Medical History  Diagnosis Date  . Pharyngitis 09/02/2011    Past Surgical History  Procedure Laterality Date  . No past surgeries      Family History  Problem Relation Age of Onset  . Stroke Paternal Grandfather   . Heart disease Paternal Grandfather   . Diabetes Paternal Grandmother     History  Substance Use Topics  . Smoking status: Never Smoker   . Smokeless tobacco: Never Used  . Alcohol Use: 0.5 oz/week    1 drink(s) per week     Comment: OCCASIONAL      Review of Systems  Constitutional: Negative for fever.  HENT: Positive for dental problem.   Respiratory: Negative for shortness of breath.   Cardiovascular: Negative for chest pain.  Gastrointestinal: Negative for nausea, vomiting, abdominal pain and diarrhea.  All other systems reviewed and are negative.    Allergies  Review of patient's allergies indicates no known allergies.  Home Medications   Current Outpatient Rx  Name  Route  Sig  Dispense  Refill  . benzocaine (ORAJEL) 10 % mucosal gel   Mouth/Throat   Use as directed 1 application in the mouth or throat as needed for pain.         . naproxen sodium (ANAPROX) 220 MG tablet   Oral   Take 220 mg by mouth 2 (two) times daily as needed (for pain).           BP 139/93  Pulse 64  Temp(Src) 98.9 F (37.2 C) (Oral)  Resp 18  Ht 5\' 11"  (1.803 m)  Wt  158 lb (71.668 kg)  BMI 22.05 kg/m2  SpO2 100%  Physical Exam  Nursing note and vitals reviewed. Constitutional: He is oriented to person, place, and time. He appears well-developed and well-nourished. No distress.  HENT:  Head: Normocephalic.  Mouth/Throat: Oropharynx is clear and moist.    Generally poor dentition with multiple dental caries. No signs of dental abscess.  Eyes: Conjunctivae and EOM are normal. Pupils are equal, round, and reactive to light.  Neck: Normal range of motion.  Cardiovascular: Normal rate, regular rhythm and intact distal pulses.   Pulmonary/Chest: Effort normal and breath sounds normal. No stridor. No respiratory distress. He has no wheezes. He has no rales. He exhibits no tenderness.  Abdominal: Soft. Bowel sounds are normal. He exhibits no distension and no mass. There is no tenderness. There is no rebound and no guarding.  Musculoskeletal: Normal range of motion.  Lymphadenopathy:    He has no cervical adenopathy.  Neurological: He is alert and oriented to person, place, and time.  Psychiatric: He has a normal mood and affect.    ED Course  Procedures (including critical care time)  Labs Reviewed - No data to display No results found.   1. Pain, dental       MDM   Kenneth Olsen is a 27 y.o. male with  dental caries and pain, no signs of infection. Open given amoxicillin prophylactically.   Filed Vitals:   08/15/12 0423 08/15/12 0429 08/15/12 0548  BP: 139/93  143/86  Pulse: 64  81  Temp: 98.9 F (37.2 C)  98.6 F (37 C)  TempSrc: Oral  Oral  Resp: 18  18  Height: 5\' 11"  (1.803 m) 5\' 11"  (1.803 m)   Weight: 158 lb (71.668 kg)    SpO2: 100%  100%     Pt verbalized understanding and agrees with care plan. Outpatient follow-up and return precautions given.    Discharge Medication List as of 08/15/2012  5:24 AM    START taking these medications   Details  amoxicillin (AMOXIL) 500 MG capsule Take 1 capsule (500 mg total) by mouth  3 (three) times daily., Starting 08/15/2012, Until Discontinued, Print    oxyCODONE-acetaminophen (PERCOCET/ROXICET) 5-325 MG per tablet 1 to 2 tabs PO q6hrs  PRN for pain, Print               Wynetta Emery, PA-C 08/17/12 1516  Wynetta Emery, PA-C 08/17/12 1516

## 2012-08-18 NOTE — ED Provider Notes (Signed)
Medical screening examination/treatment/procedure(s) were performed by non-physician practitioner and as supervising physician I was immediately available for consultation/collaboration.   Sunnie Nielsen, MD 08/18/12 [phone]

## 2014-02-24 IMAGING — CT CT NECK W/ CM
4 series · 16 of 33 positions shown, 19 images · IV contrast (APPLIED)
Comparison: None.

CLINICAL DATA: Sore throat, fever, difficulty swallowing for 1
week.

CT NECK WITH CONTRAST
TECHNIQUE: Multidetector CT imaging of the neck was performed with
intravenous contrast.
Contrast: 100mL OMNIPAQUE IOHEXOL 300 MG/ML  SOLN

[Series 3: st neck 2.0 b31s · axial · 0.54mm/px · z∈[-190,-32]mm · 5 of 119 slices shown, 7 images]
[im 20/119  soft-tissue]
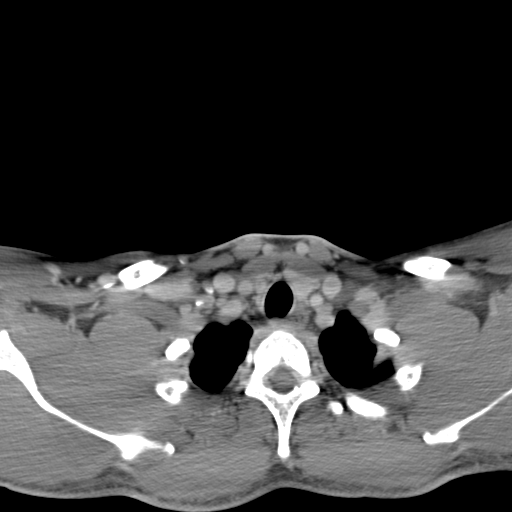
[im 20/119  bone]
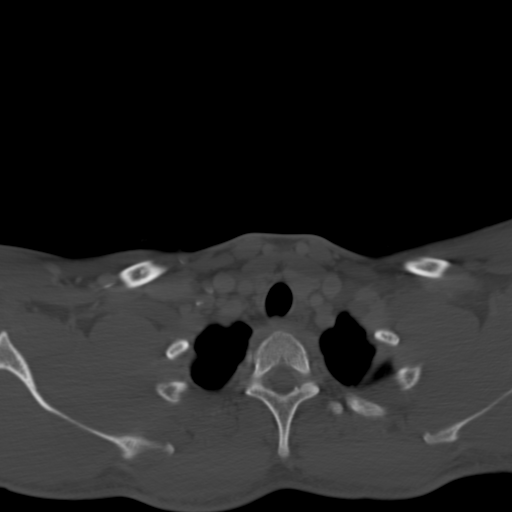
[im 40/119  bone]
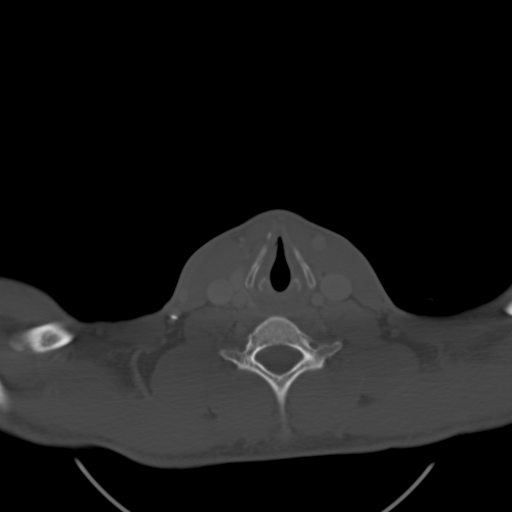
[im 60/119  bone]
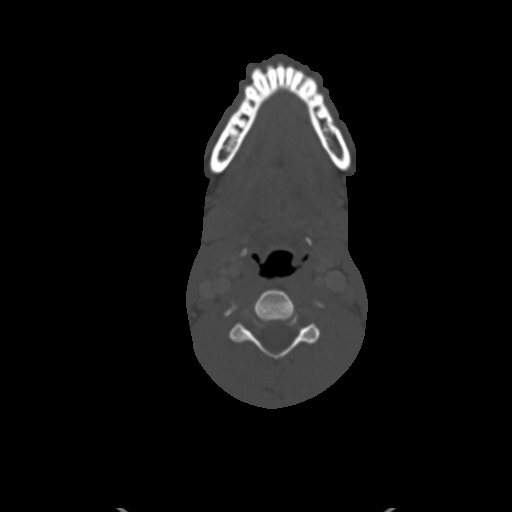
[im 79/119  bone]
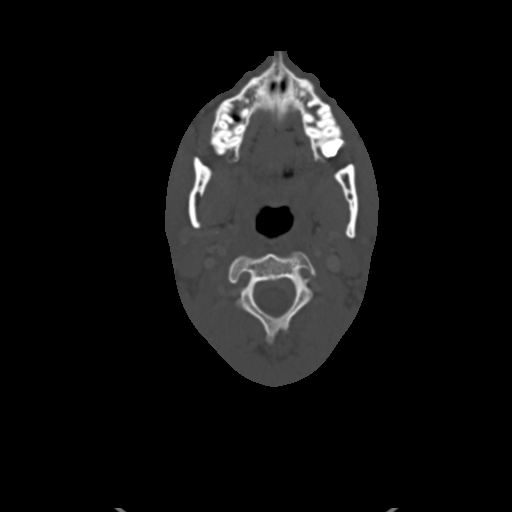
[im 99/119  soft-tissue]
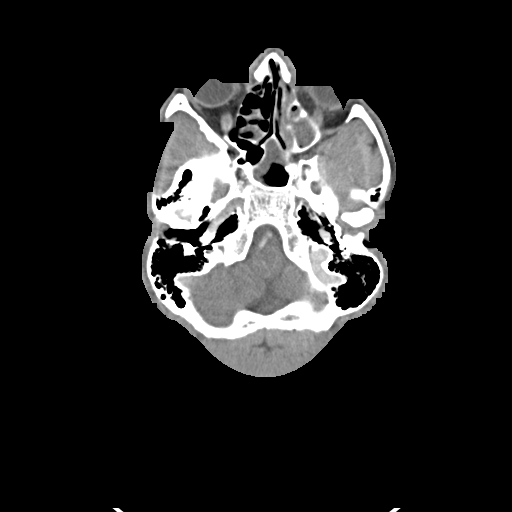
[im 99/119  bone]
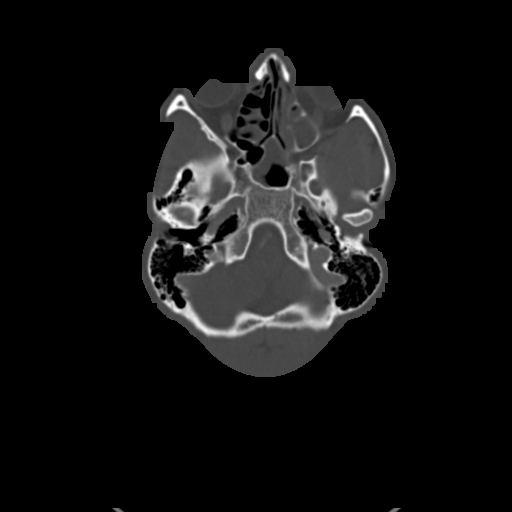

[Series 602: cor · coronal · 0.54mm/px · 3 of 110 slices shown]
[im 28/110  bone]
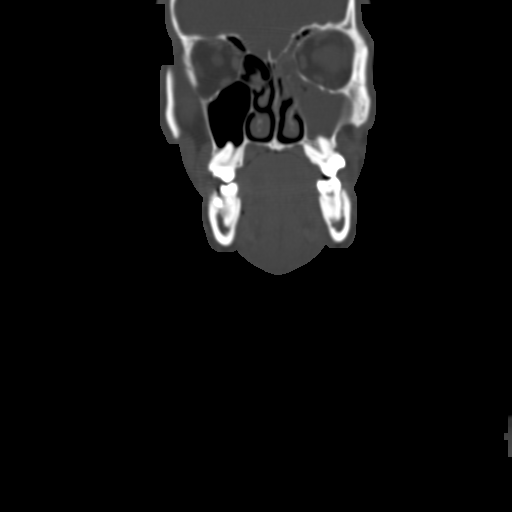
[im 46/110  bone]
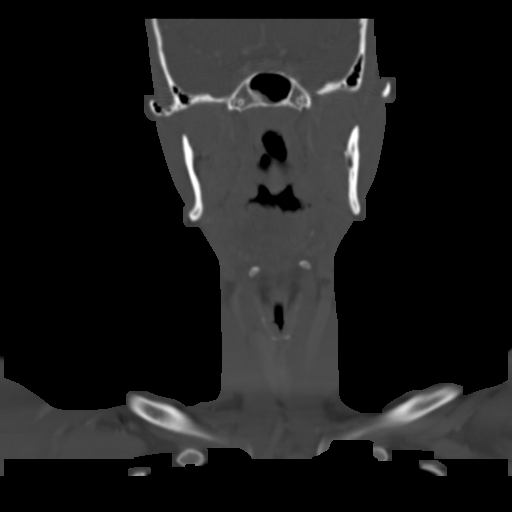
[im 64/110  bone]
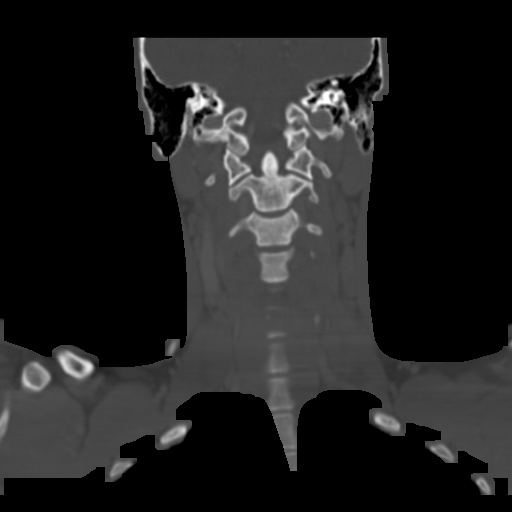

[Series 603: axial · axial · 0.54mm/px · z∈[-229,-159]mm · 3 of 111 slices shown]
[im 19/111  bone]
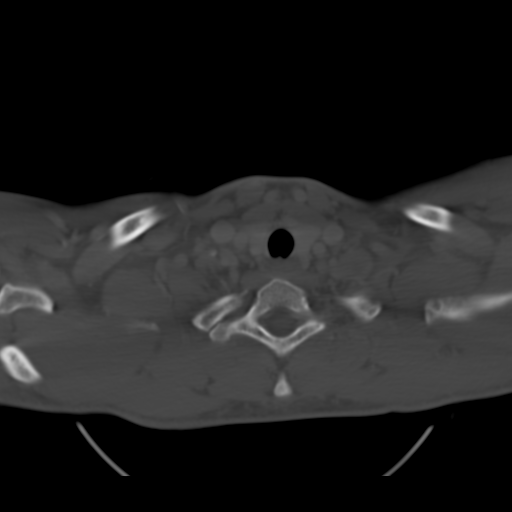
[im 37/111  bone]
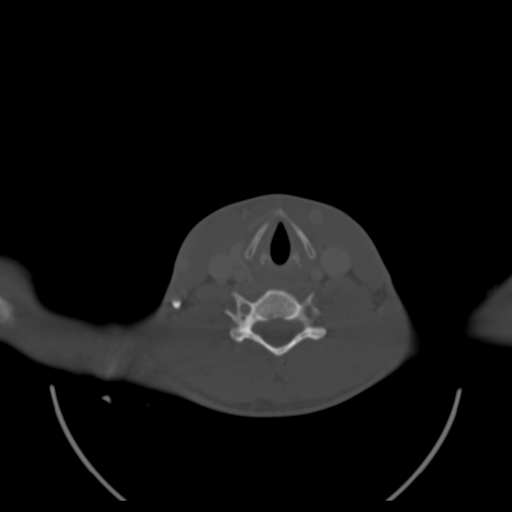
[im 56/111  bone]
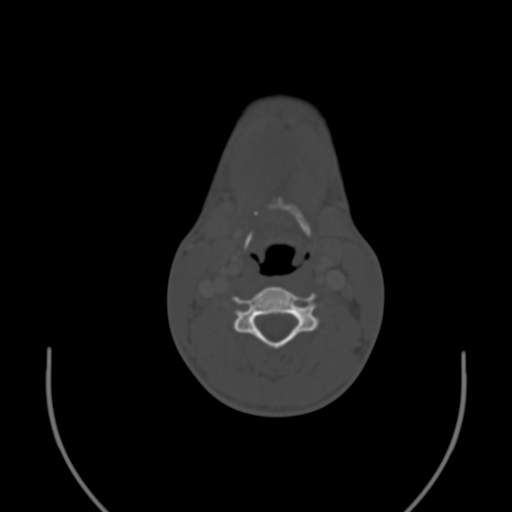

[Series 604: sag · sagittal · 0.54mm/px · 5 of 71 slices shown, 6 images]
[im 24/71  bone]
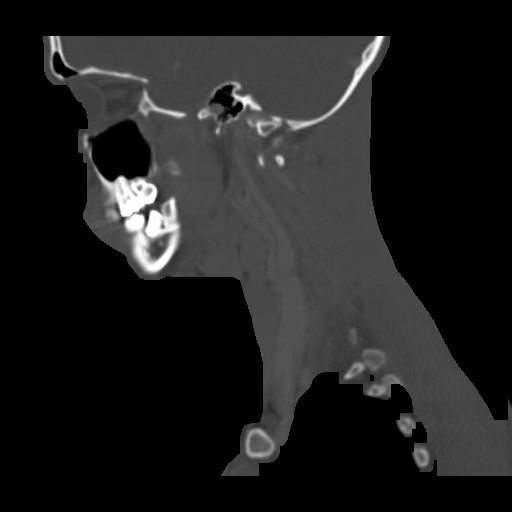
[im 30/71  bone]
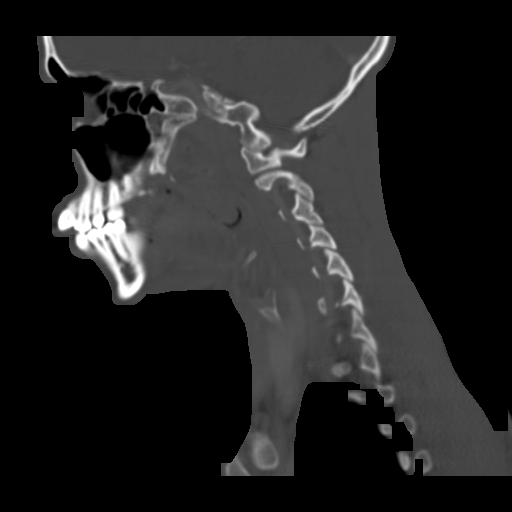
[im 36/71  soft-tissue]
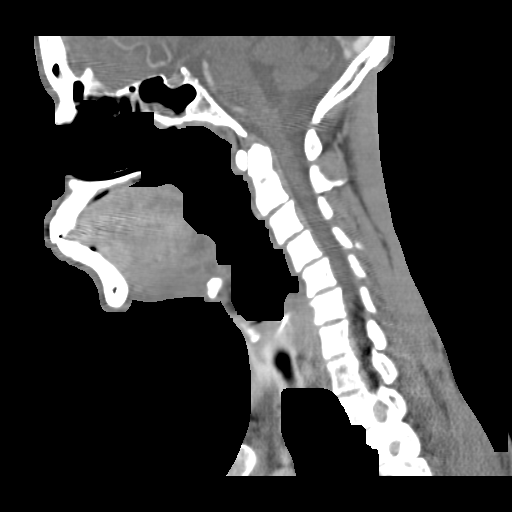
[im 36/71  bone]
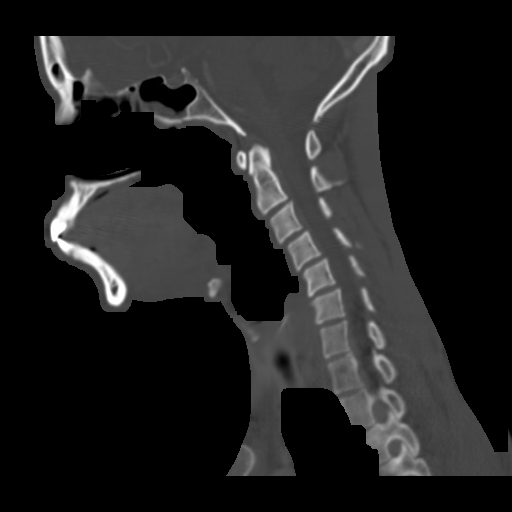
[im 41/71  bone]
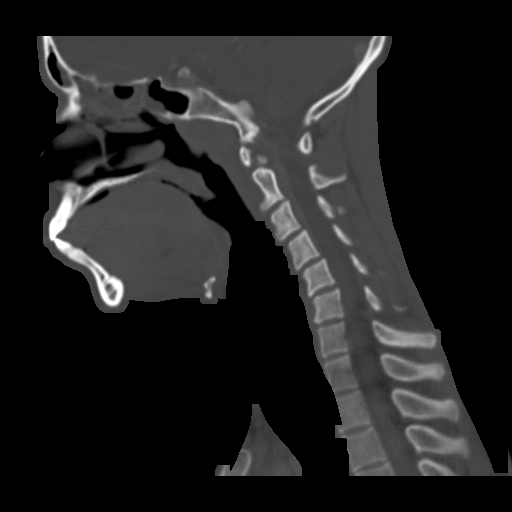
[im 47/71  bone]
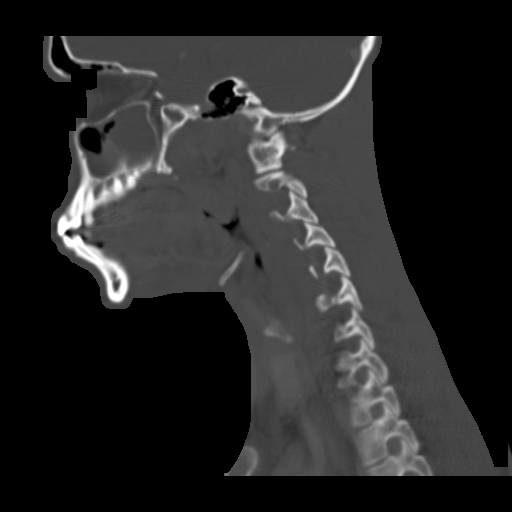

[16 of 33 positions shown; findings below may reference images not displayed]

FINDINGS: There is opacification of the left maxillary antrum, left
ethmoid air cells, and portions of the left frontal and sphenoid
sinuses.  Changes are likely to represent sinusitis.  Tonsils
appear moderately prominent consistent with inflammatory swelling.
No tonsillar or peritonsillar abscess is demonstrated.  Cervical
carotid and jugular vessels appear patent.  No displacement or
effacement of the cervical fat planes.  Salivary glands appear
symmetrical.  The epiglottis appears mildly thickened and
edematous.  There are small circumscribed fluid collections
anterior to the base of the epiglottis and superior to the thyroid
bone, likely in the tongue base region.  These may represent small
abscesses. Scattered cervical and submental lymph nodes without
pathologic enlargement, likely reactive.  Thyroid gland is
unremarkable.  Visualized lung apices are unremarkable.  Cervical
airways appear patent.
IMPRESSION: Mild thickening of the epiglottis.  Small circumscribed fluid
collections in the base of the tongue and in the suprahyoid soft
tissues suggesting small abscesses.  Probable reactive cervical and
submental lymph nodes.  Mild tonsillar swelling.  Opacification of
the left paranasal sinuses suggesting sinusitis.

## 2014-03-30 ENCOUNTER — Emergency Department (HOSPITAL_COMMUNITY): Payer: BC Managed Care – PPO

## 2014-03-30 ENCOUNTER — Emergency Department (HOSPITAL_COMMUNITY)
Admission: EM | Admit: 2014-03-30 | Discharge: 2014-03-30 | Disposition: A | Discharge status: Home / Self Care | Payer: BC Managed Care – PPO | Attending: Emergency Medicine | Admitting: Emergency Medicine

## 2014-03-30 ENCOUNTER — Encounter (HOSPITAL_COMMUNITY): Payer: Self-pay | Admitting: Emergency Medicine

## 2014-03-30 DIAGNOSIS — R202 Paresthesia of skin: Secondary | ICD-10-CM

## 2014-03-30 DIAGNOSIS — R079 Chest pain, unspecified: Secondary | ICD-10-CM | POA: Insufficient documentation

## 2014-03-30 DIAGNOSIS — Z8709 Personal history of other diseases of the respiratory system: Secondary | ICD-10-CM | POA: Diagnosis not present

## 2014-03-30 DIAGNOSIS — M542 Cervicalgia: Secondary | ICD-10-CM | POA: Insufficient documentation

## 2014-03-30 LAB — CBC WITH DIFFERENTIAL/PLATELET
BASOS ABS: 0 10*3/uL (ref 0.0–0.1)
BASOS PCT: 0 % (ref 0–1)
EOS PCT: 3 % (ref 0–5)
Eosinophils Absolute: 0.2 10*3/uL (ref 0.0–0.7)
HEMATOCRIT: 46.1 % (ref 39.0–52.0)
Hemoglobin: 15.4 g/dL (ref 13.0–17.0)
Lymphocytes Relative: 31 % (ref 12–46)
Lymphs Abs: 2.3 10*3/uL (ref 0.7–4.0)
MCH: 30.3 pg (ref 26.0–34.0)
MCHC: 33.4 g/dL (ref 30.0–36.0)
MCV: 90.7 fL (ref 78.0–100.0)
MONO ABS: 0.8 10*3/uL (ref 0.1–1.0)
Monocytes Relative: 11 % (ref 3–12)
NEUTROS ABS: 4.1 10*3/uL (ref 1.7–7.7)
Neutrophils Relative %: 55 % (ref 43–77)
Platelets: 213 10*3/uL (ref 150–400)
RBC: 5.08 MIL/uL (ref 4.22–5.81)
RDW: 12.4 % (ref 11.5–15.5)
WBC: 7.4 10*3/uL (ref 4.0–10.5)

## 2014-03-30 LAB — I-STAT CHEM 8, ED
BUN: 11 mg/dL (ref 6–23)
CALCIUM ION: 1.15 mmol/L (ref 1.12–1.23)
Chloride: 102 mEq/L (ref 96–112)
Creatinine, Ser: 1.1 mg/dL (ref 0.50–1.35)
Glucose, Bld: 92 mg/dL (ref 70–99)
HEMATOCRIT: 51 % (ref 39.0–52.0)
Hemoglobin: 17.3 g/dL — ABNORMAL HIGH (ref 13.0–17.0)
Potassium: 3.9 mEq/L (ref 3.7–5.3)
Sodium: 141 mEq/L (ref 137–147)
TCO2: 26 mmol/L (ref 0–100)

## 2014-03-30 LAB — I-STAT TROPONIN, ED: Troponin i, poc: 0.01 ng/mL (ref 0.00–0.08)

## 2014-03-30 LAB — BASIC METABOLIC PANEL
Anion gap: 12 (ref 5–15)
BUN: 11 mg/dL (ref 6–23)
CO2: 27 mEq/L (ref 19–32)
Calcium: 9 mg/dL (ref 8.4–10.5)
Chloride: 103 mEq/L (ref 96–112)
Creatinine, Ser: 1.11 mg/dL (ref 0.50–1.35)
GFR, EST NON AFRICAN AMERICAN: 90 mL/min — AB (ref >90)
Glucose, Bld: 90 mg/dL (ref 70–99)
POTASSIUM: 4.1 meq/L (ref 3.7–5.3)
Sodium: 142 mEq/L (ref 137–147)

## 2014-03-30 NOTE — ED Notes (Signed)
According to EMS the patient called them because after having sex with his wife he started having numbness and tingling to both his arms.  His NIH was negative.  The patient was also having some neck pain but denies any injury.

## 2014-03-30 NOTE — ED Notes (Signed)
According to EMS the patient called them because after having sex with his wife he started having numbness and tingling to both his arms.  His NIH was negative.  The patient is complaining of chest pain, he describes as "soreness".  He denies any other symptoms.  He is still complaining of the numbness in his hands.

## 2014-03-30 NOTE — ED Provider Notes (Signed)
CSN: [id number]     Arrival date & time 03/30/14  0132 History   First MD Initiated Contact with Patient 03/30/14 0206     Chief Complaint  Patient presents with  . Chest Pain    According to EMS the patient called them because after having sex with his wife he started having numbness and tingling to both his arms.  His NIH was negative.  . Numbness     (Consider location/radiation/quality/duration/timing/severity/associated sxs/prior Treatment) HPI  This is a 28 year old male who presents with paresthesias. Patient reports onset of bilateral hand paresthesias. Paresthesias began following sexual activity.  He reports that he has had similar symptoms in the past when trying to go to sleep. Denies any neck pain or chest pain to me. However, he states that when he was in the ambulance "had a little bit of neck pain." He denies any injury. It was also noted in nursing documentation that he complained of chest pain which he denies to me. He denies any shortness of breath, recent illnesses.   Past Medical History  Diagnosis Date  . Pharyngitis 09/02/2011   Past Surgical History  Procedure Laterality Date  . No past surgeries     Family History  Problem Relation Age of Onset  . Stroke Paternal Grandfather   . Heart disease Paternal Grandfather   . Diabetes Paternal Grandmother    History  Substance Use Topics  . Smoking status: Never Smoker   . Smokeless tobacco: Never Used  . Alcohol Use: 0.5 oz/week    1 drink(s) per week     Comment: OCCASIONAL    Review of Systems  Constitutional: Negative.  Negative for fever.  Respiratory: Negative.  Negative for chest tightness and shortness of breath.   Cardiovascular: Negative.  Negative for chest pain.  Gastrointestinal: Negative.  Negative for abdominal pain.  Genitourinary: Negative.  Negative for dysuria.  Musculoskeletal: Positive for neck pain. Negative for neck stiffness.  Neurological: Positive for numbness. Negative for  dizziness, weakness and headaches.  All other systems reviewed and are negative.     Allergies  Review of patient's allergies indicates no known allergies.  Home Medications   Prior to Admission medications   Medication Sig Start Date End Date Taking? Authorizing Provider  benzocaine (ORAJEL) 10 % mucosal gel Use as directed 1 application in the mouth or throat as needed for pain.   Yes Historical Provider, MD  ibuprofen (ADVIL,MOTRIN) 200 MG tablet Take 400 mg by mouth every 6 (six) hours as needed for moderate pain.   Yes Historical Provider, MD  naproxen sodium (ANAPROX) 220 MG tablet Take 220 mg by mouth 2 (two) times daily as needed (for pain).   Yes Historical Provider, MD  amoxicillin (AMOXIL) 500 MG capsule Take 1 capsule (500 mg total) by mouth 3 (three) times daily. Patient not taking: Reported on 03/30/2014 08/15/12   Joni ReiningNicole Pisciotta, PA-C  oxyCODONE-acetaminophen (PERCOCET/ROXICET) 5-325 MG per tablet Take 2 tablets by mouth every 4 (four) hours as needed for pain. Patient not taking: Reported on 03/30/2014 08/15/12   Glynn OctaveStephen Rancour, MD   BP 134/78 mmHg  Pulse 83  Temp(Src) 98.7 F (37.1 C) (Oral)  Resp 22  SpO2 100% Physical Exam  Constitutional: He is oriented to person, place, and time. He appears well-developed and well-nourished. No distress.  HENT:  Head: Normocephalic and atraumatic.  Eyes: EOM are normal. Pupils are equal, round, and reactive to light.  Neck: Normal range of motion. Neck supple.  Cardiovascular: Normal rate,  regular rhythm and normal heart sounds.   No murmur heard. Pulmonary/Chest: Effort normal and breath sounds normal. No respiratory distress. He has no wheezes.  Abdominal: Soft. Bowel sounds are normal. There is no tenderness. There is no rebound.  Musculoskeletal: He exhibits no edema.  Lymphadenopathy:    He has no cervical adenopathy.  Neurological: He is alert and oriented to person, place, and time.  5 out of 5 grip strength  bilaterally, gross sensation intact  Skin: Skin is warm and dry.  Psychiatric: He has a normal mood and affect.  Nursing note and vitals reviewed.   ED Course  Procedures (including critical care time) Labs Review Labs Reviewed  BASIC METABOLIC PANEL - Abnormal; Notable for the following:    GFR calc non Af Amer 90 (*)    All other components within normal limits  I-STAT CHEM 8, ED - Abnormal; Notable for the following:    Hemoglobin 17.3 (*)    All other components within normal limits  CBC WITH DIFFERENTIAL  I-STAT TROPOININ, ED  Rosezena SensorI-STAT TROPOININ, ED    Imaging Review Dg Chest 2 View  03/30/2014   CLINICAL DATA:  Chest pain tonight.  EXAM: CHEST  2 VIEW  COMPARISON:  07/06/2012  FINDINGS: The heart size and mediastinal contours are within normal limits. Both lungs are clear. The visualized skeletal structures are unremarkable.  IMPRESSION: No active cardiopulmonary disease.   Electronically Signed   By: Burman NievesWilliam  Stevens M.D.   On: 03/30/2014 04:23     EKG Interpretation   Date/Time:  Wednesday March 30 2014 01:48:52 EST Ventricular Rate:  70 PR Interval:  192 QRS Duration: 84 QT Interval:  354 QTC Calculation: 382 R Axis:   53 Text Interpretation:  Normal sinus rhythm Left ventricular hypertrophy  Early repolarization Abnormal ECG similar to prior Confirmed by Osby Sweetin   MD, Wanda Rideout ([id number]) on 03/30/2014 2:06:31 AM      MDM   Final diagnoses:  Chest pain  Paresthesia    Patient presents with bilateral pain and paresthesias following sexual activity. Nontoxic on exam.  Vital signs stable.  Neurologically intact. Basic labwork reassuring. Chest x-ray, EKG, and troponin also reassuring. Patient is PERC neg.  On reevaluation, patient reports gradual improvement of bilateral paresthesias. He continues to be chest pain-free but nursing did report that he had one episode of chest pain here. He is low risk for ACS without any risk factors.  Unsure at this time the  etiology of his symptoms. However, medical screening tests and workup in the ER reassuring and doubt acute emergent process. Patient to follow-up with PCP.  After history, exam, and medical workup I feel the patient has been appropriately medically screened and is safe for discharge home. Pertinent diagnoses were discussed with the patient. Patient was given return precautions.     Shon Batonourtney F Carriann Hesse, MD 03/30/14 [phone]

## 2014-03-30 NOTE — Discharge Instructions (Signed)
You were evaluated today for Chest pain and paresthesias. Her workup is unremarkable. He should take ibuprofen for any recurrence of pain.  Establish primary care at [facility]. See return precautions below.  Chest Pain (Nonspecific) It is often hard to give a specific diagnosis for the cause of chest pain. There is always a chance that your pain could be related to something serious, such as a heart attack or a blood clot in the lungs. You need to follow up with your health care provider for further evaluation. CAUSES   Heartburn.  Pneumonia or bronchitis.  Anxiety or stress.  Inflammation around your heart (pericarditis) or lung (pleuritis or pleurisy).  A blood clot in the lung.  A collapsed lung (pneumothorax). It can develop suddenly on its own (spontaneous pneumothorax) or from trauma to the chest.  Shingles infection (herpes zoster virus). The chest wall is composed of bones, muscles, and cartilage. Any of these can be the source of the pain.  The bones can be bruised by injury.  The muscles or cartilage can be strained by coughing or overwork.  The cartilage can be affected by inflammation and become sore (costochondritis). DIAGNOSIS  Lab tests or other studies may be needed to find the cause of your pain. Your health care provider may have you take a test called an ambulatory electrocardiogram (ECG). An ECG records your heartbeat patterns over a 24-hour period. You may also have other tests, such as:  Transthoracic echocardiogram (TTE). During echocardiography, sound waves are used to evaluate how blood flows through your heart.  Transesophageal echocardiogram (TEE).  Cardiac monitoring. This allows your health care provider to monitor your heart rate and rhythm in real time.  Holter monitor. This is a portable device that records your heartbeat and can help diagnose heart arrhythmias. It allows your health care provider to track your heart activity for several days, if  needed.  Stress tests by exercise or by giving medicine that makes the heart beat faster. TREATMENT   Treatment depends on what may be causing your chest pain. Treatment may include:  Acid blockers for heartburn.  Anti-inflammatory medicine.  Pain medicine for inflammatory conditions.  Antibiotics if an infection is present.  You may be advised to change lifestyle habits. This includes stopping smoking and avoiding alcohol, caffeine, and chocolate.  You may be advised to keep your head raised (elevated) when sleeping. This reduces the chance of acid going backward from your stomach into your esophagus. Most of the time, nonspecific chest pain will improve within 2-3 days with rest and mild pain medicine.  HOME CARE INSTRUCTIONS   If antibiotics were prescribed, take them as directed. Finish them even if you start to feel better.  For the next few days, avoid physical activities that bring on chest pain. Continue physical activities as directed.  Do not use any tobacco products, including cigarettes, chewing tobacco, or electronic cigarettes.  Avoid drinking alcohol.  Only take medicine as directed by your health care provider.  Follow your health care provider's suggestions for further testing if your chest pain does not go away.  Keep any follow-up appointments you made. If you do not go to an appointment, you could develop lasting (chronic) problems with pain. If there is any problem keeping an appointment, call to reschedule. SEEK MEDICAL CARE IF:   Your chest pain does not go away, even after treatment.  You have a rash with blisters on your chest.  You have a fever. SEEK IMMEDIATE MEDICAL CARE IF:  You have increased chest pain or pain that spreads to your arm, neck, jaw, back, or abdomen.  You have shortness of breath.  You have an increasing cough, or you cough up blood.  You have severe back or abdominal pain.  You feel nauseous or vomit.  You have severe  weakness.  You faint.  You have chills. This is an emergency. Do not wait to see if the pain will go away. Get medical help at once. Call your local emergency services (911 in [place] Do not drive yourself to the hospital. MAKE SURE YOU:   Understand these instructions.  Will watch your condition.  Will get help right away if you are not doing well or get worse. Document Released: 02/06/2005 Document Revised: 05/04/2013 Document Reviewed: 12/03/2007 [facility]ExitCare Patient Information 2015 [place], [place]. This information is not intended to replace advice given to you by your health care provider. Make sure you discuss any questions you have with your health care provider.   Paresthesia Paresthesia is an abnormal burning or prickling sensation. This sensation is generally felt in the hands, arms, legs, or feet. However, it may occur in any part of the body. It is usually not painful. The feeling may be described as:  Tingling or numbness.  "Pins and needles."  Skin crawling.  Buzzing.  Limbs "falling asleep."  Itching. Most people experience temporary (transient) paresthesia at some time in their lives. CAUSES  Paresthesia may occur when you breathe too quickly (hyperventilation). It can also occur without any apparent cause. Commonly, paresthesia occurs when pressure is placed on a nerve. The feeling quickly goes away once the pressure is removed. For some people, however, paresthesia is a long-lasting (chronic) condition caused by an underlying disorder. The underlying disorder may be:  A traumatic, direct injury to nerves. Examples include a:  Broken (fractured) neck.  Fractured skull.  A disorder affecting the brain and spinal cord (central nervous system). Examples include:  Transverse myelitis.  Encephalitis.  Transient ischemic attack.  Multiple sclerosis.  Stroke.  Tumor or blood vessel problems, such as an arteriovenous malformation pressing against the brain or  spinal cord.  A condition that damages the peripheral nerves (peripheral neuropathy). Peripheral nerves are not part of the brain and spinal cord. These conditions include:  Diabetes.  Peripheral vascular disease.  Nerve entrapment syndromes, such as carpal tunnel syndrome.  Shingles.  Hypothyroidism.  Vitamin B12 deficiencies.  Alcoholism.  Heavy metal poisoning (lead, arsenic).  Rheumatoid arthritis.  Systemic lupus erythematosus. DIAGNOSIS  Your caregiver will attempt to find the underlying cause of your paresthesia. Your caregiver may:  Take your medical history.  Perform a physical exam.  Order various lab tests.  Order imaging tests. TREATMENT  Treatment for paresthesia depends on the underlying cause. HOME CARE INSTRUCTIONS  Avoid drinking alcohol.  You may consider massage or acupuncture to help relieve your symptoms.  Keep all follow-up appointments as directed by your caregiver. SEEK IMMEDIATE MEDICAL CARE IF:   You feel weak.  You have trouble walking or moving.  You have problems with speech or vision.  You feel confused.  You cannot control your bladder or bowel movements.  You feel numbness after an injury.  You faint.  Your burning or prickling feeling gets worse when walking.  You have pain, cramps, or dizziness.  You develop a rash. MAKE SURE YOU:  Understand these instructions.  Will watch your condition.  Will get help right away if you are not doing well or get worse.  Document Released: 04/19/2002 Document Revised: 07/22/2011 Document Reviewed: 01/18/2011 [facility] Patient Information 2015 [place], [place]. This information is not intended to replace advice given to you by your health care provider. Make sure you discuss any questions you have with your health care provider.

## 2014-11-25 IMAGING — CR DG CHEST 2V
2 series · 2 of 2 positions shown · non-contrast
Comparison: None.

CLINICAL DATA: Chest pain

CHEST - 2 VIEW

[w chest pa]
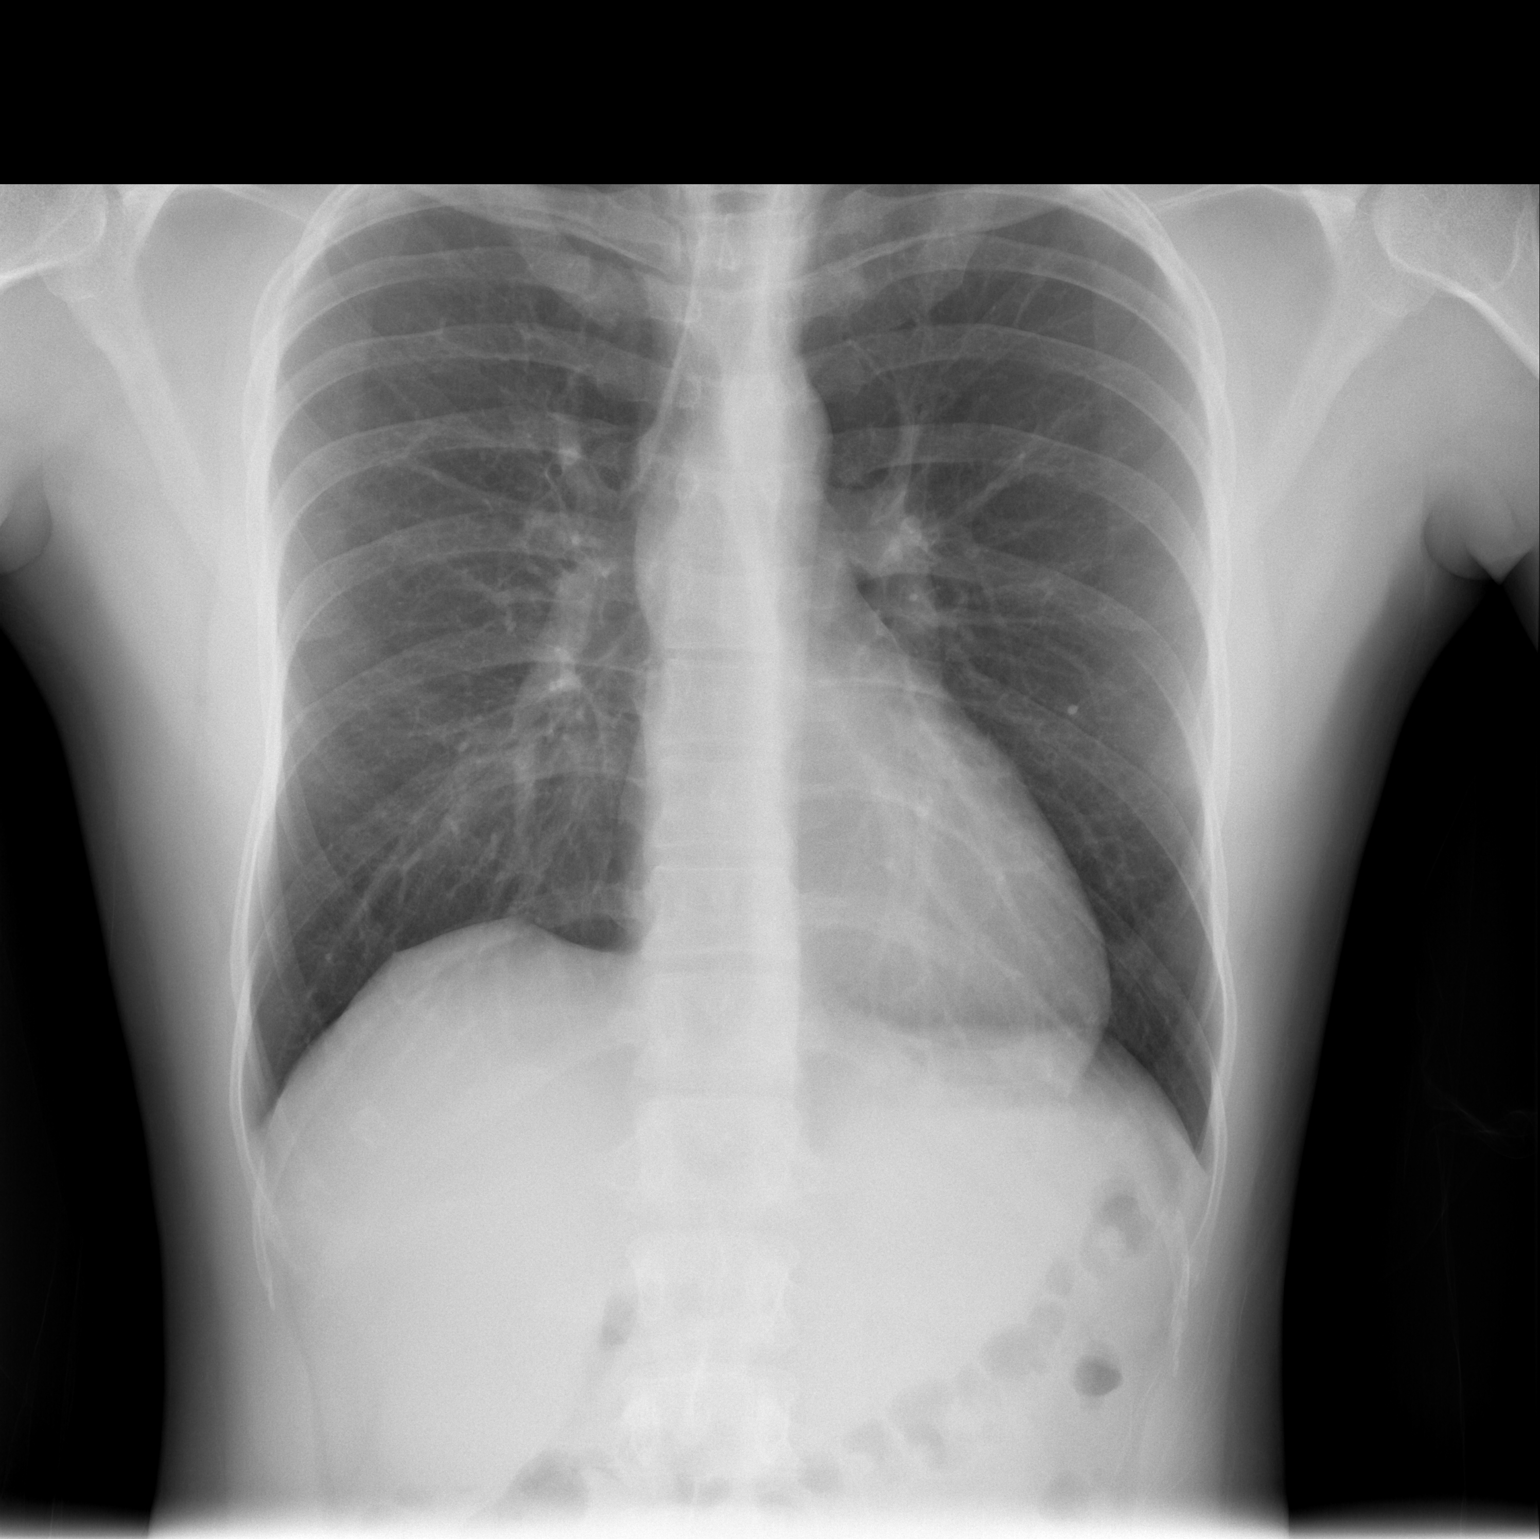

[w chest lat]
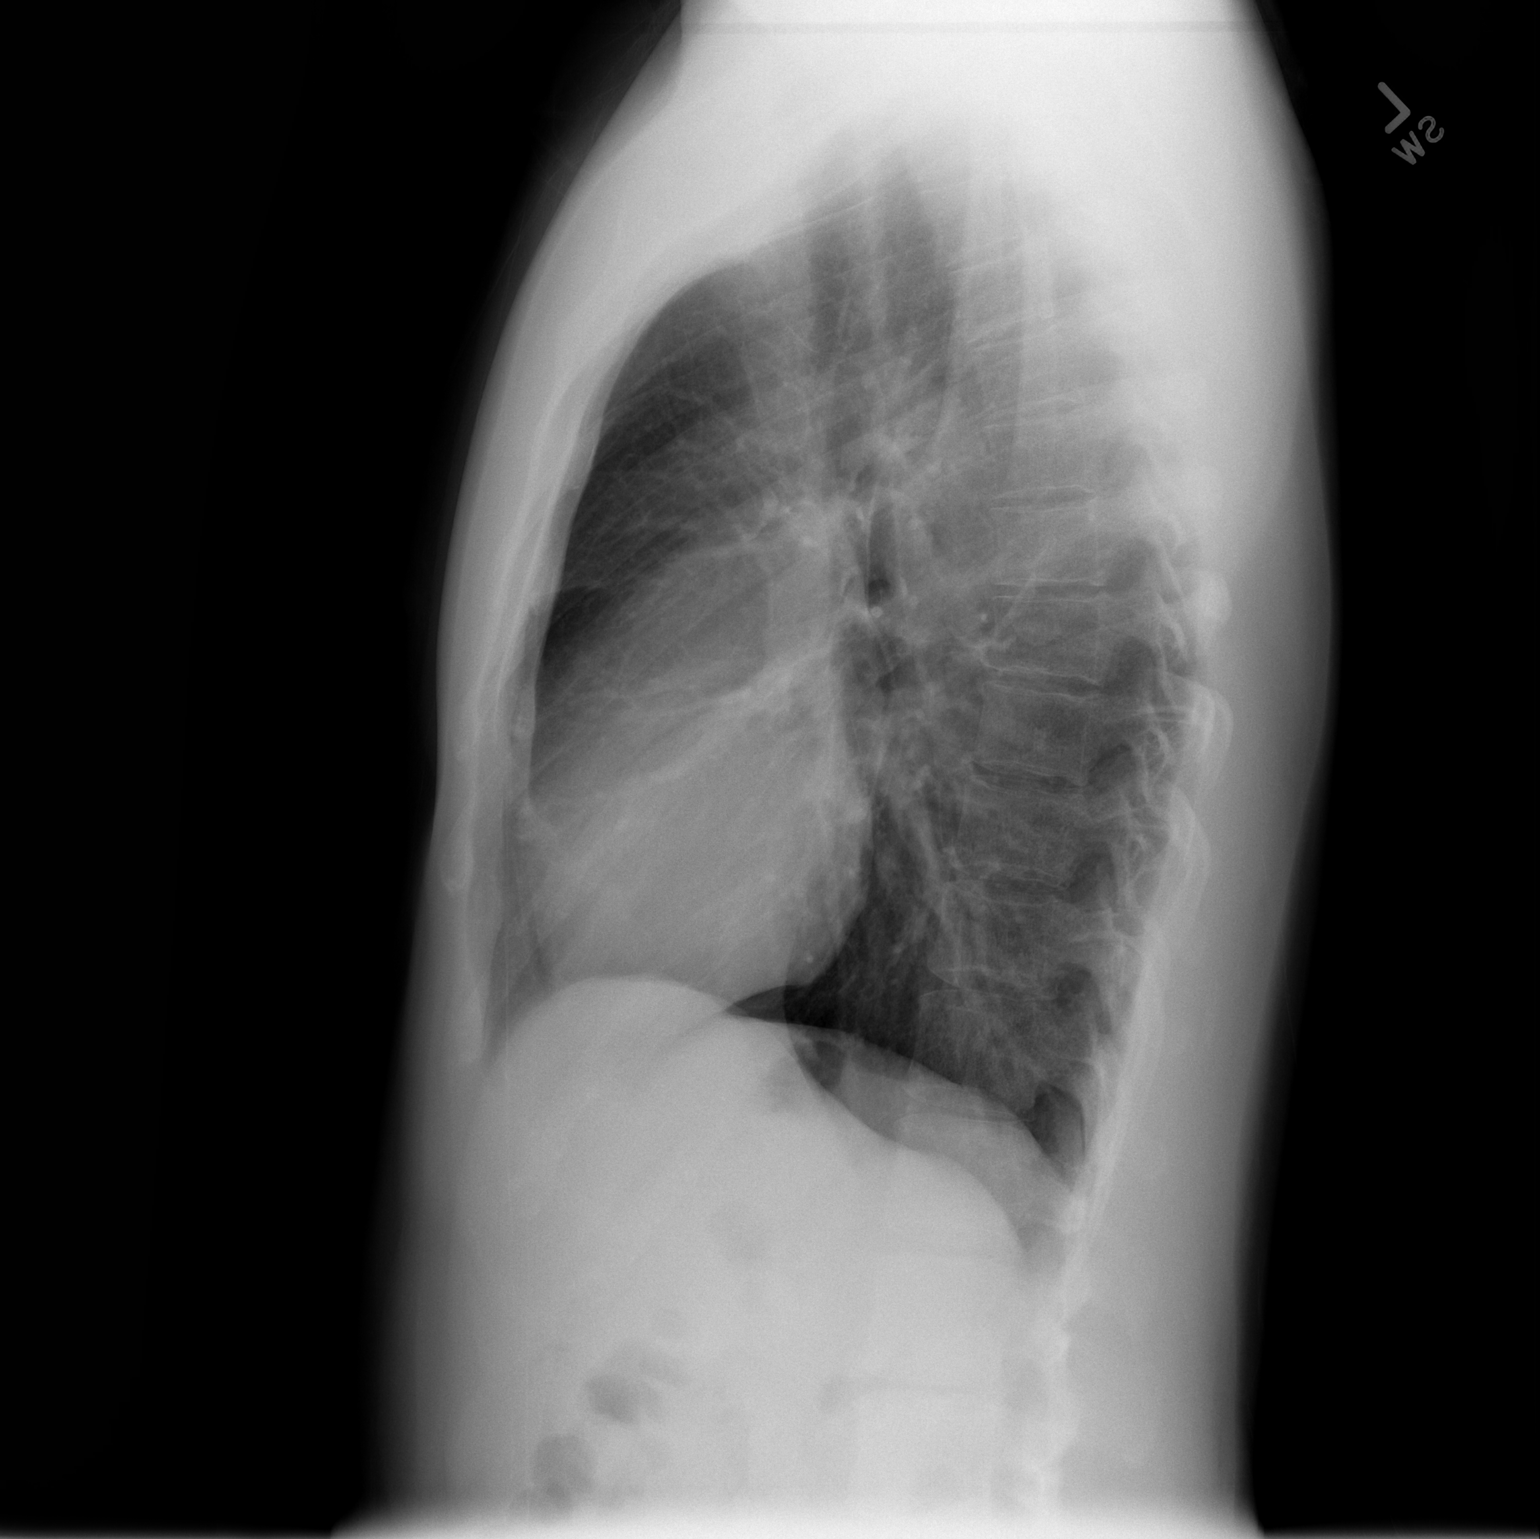

[2 of 2 positions shown; findings below may reference images not displayed]

FINDINGS: The heart size and vascular pattern are normal.  No
pleural effusions.  Lungs are clear.
IMPRESSION: Normal study
